# Patient Record
Sex: Male | Born: 1950 | Race: White | Hispanic: No | Marital: Married | Smoking: Former smoker
Health system: Southern US, Community
[De-identification: ages and names within clinical notes are randomized; demographics above are authoritative.]

## PROBLEM LIST (undated history)

## (undated) DIAGNOSIS — R519 Headache, unspecified: Secondary | ICD-10-CM

## (undated) DIAGNOSIS — R51 Headache: Secondary | ICD-10-CM

## (undated) DIAGNOSIS — R39198 Other difficulties with micturition: Secondary | ICD-10-CM

## (undated) DIAGNOSIS — Z842 Family history of other diseases of the genitourinary system: Secondary | ICD-10-CM

## (undated) DIAGNOSIS — K30 Functional dyspepsia: Secondary | ICD-10-CM

## (undated) DIAGNOSIS — M1990 Unspecified osteoarthritis, unspecified site: Secondary | ICD-10-CM

## (undated) DIAGNOSIS — G43909 Migraine, unspecified, not intractable, without status migrainosus: Secondary | ICD-10-CM

## (undated) DIAGNOSIS — Z973 Presence of spectacles and contact lenses: Secondary | ICD-10-CM

## (undated) DIAGNOSIS — R12 Heartburn: Secondary | ICD-10-CM

## (undated) DIAGNOSIS — Z789 Other specified health status: Secondary | ICD-10-CM

## (undated) HISTORY — DX: Heartburn: R12

## (undated) HISTORY — DX: Migraine, unspecified, not intractable, without status migrainosus: G43.909

## (undated) HISTORY — DX: Unspecified osteoarthritis, unspecified site: M19.90

## (undated) HISTORY — DX: Other difficulties with micturition: R39.198

## (undated) HISTORY — DX: Headache, unspecified: R51.9

## (undated) HISTORY — DX: Family history of other diseases of the genitourinary system: Z84.2

## (undated) HISTORY — DX: Functional dyspepsia: K30

## (undated) HISTORY — DX: Headache: R51

## (undated) HISTORY — PX: TONSILLECTOMY: SUR1361

---
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
## 1999-11-26 ENCOUNTER — Ambulatory Visit (HOSPITAL_BASED_OUTPATIENT_CLINIC_OR_DEPARTMENT_OTHER): Admission: RE | Admit: 1999-11-26 | Discharge: 1999-11-26 | Payer: Self-pay | Admitting: Otolaryngology

## 1999-12-20 ENCOUNTER — Ambulatory Visit (HOSPITAL_BASED_OUTPATIENT_CLINIC_OR_DEPARTMENT_OTHER): Admission: RE | Admit: 1999-12-20 | Discharge: 1999-12-20 | Payer: Self-pay | Admitting: Otolaryngology

## 2012-06-29 ENCOUNTER — Other Ambulatory Visit: Payer: Self-pay | Admitting: Family Medicine

## 2012-06-29 DIAGNOSIS — Z0000 Encounter for general adult medical examination without abnormal findings: Secondary | ICD-10-CM

## 2012-06-30 ENCOUNTER — Ambulatory Visit (INDEPENDENT_AMBULATORY_CARE_PROVIDER_SITE_OTHER): Payer: BC Managed Care – PPO | Admitting: Surgery

## 2012-07-05 ENCOUNTER — Ambulatory Visit
Admission: RE | Admit: 2012-07-05 | Discharge: 2012-07-05 | Disposition: A | Discharge status: Home / Self Care | Payer: BC Managed Care – PPO | Source: Ambulatory Visit | Attending: Family Medicine | Admitting: Family Medicine

## 2012-07-05 DIAGNOSIS — Z0000 Encounter for general adult medical examination without abnormal findings: Secondary | ICD-10-CM

## 2012-07-11 ENCOUNTER — Encounter (INDEPENDENT_AMBULATORY_CARE_PROVIDER_SITE_OTHER): Payer: Self-pay | Admitting: Surgery

## 2012-07-12 ENCOUNTER — Encounter (INDEPENDENT_AMBULATORY_CARE_PROVIDER_SITE_OTHER): Payer: Self-pay | Admitting: Surgery

## 2012-07-12 ENCOUNTER — Encounter (INDEPENDENT_AMBULATORY_CARE_PROVIDER_SITE_OTHER): Payer: Self-pay | Admitting: General Surgery

## 2012-07-12 ENCOUNTER — Ambulatory Visit (INDEPENDENT_AMBULATORY_CARE_PROVIDER_SITE_OTHER): Payer: BC Managed Care – PPO | Admitting: Surgery

## 2012-07-12 VITALS — BP 132/84 | HR 76 | Temp 97.6°F | Resp 18 | Ht 74.5 in | Wt 195.0 lb

## 2012-07-12 DIAGNOSIS — L723 Sebaceous cyst: Secondary | ICD-10-CM

## 2012-07-12 DIAGNOSIS — L729 Follicular cyst of the skin and subcutaneous tissue, unspecified: Secondary | ICD-10-CM

## 2012-07-12 NOTE — Progress Notes (Signed)
Patient ID: Bernard Gonzalez, male   DOB: 09-Oct-1950, 62 y.o.   MRN: [id number]  Chief Complaint  Patient presents with  . New Evaluation    cyst left buttock    HPI Bernard Gonzalez is a 62 y.o. male.   HPI This is a pleasant gentleman referred by Dr. Tenny Craw for evaluation of a chronic cyst on his left buttock. This has been present for over a year it intermittently drains. He has needed intermittent antibiotics for recurrent infections. Currently, he is without pain. He has had sebaceous cysts in the past. He has no previous history of trauma to this area History reviewed. No pertinent past medical history.  Past Surgical History  Procedure Laterality Date  . Tibia fracture surgery    . Tonsillectomy      History reviewed. No pertinent family history.  Social History History  Substance Use Topics  . Smoking status: Former Smoker -- 2.00 packs/day    Types: Cigarettes  . Smokeless tobacco: Not on file     Comment: stppoed 07/03/12  . Alcohol Use: Yes     Comment: social wine and beer    No Known Allergies  Current Outpatient Prescriptions  Medication Sig Dispense Refill  . calcium-vitamin D (OSCAL) 250-125 MG-UNIT per tablet Take 1 tablet by mouth daily.      Marland Kitchen ibuprofen (ADVIL,MOTRIN) 200 MG tablet Take 200 mg by mouth every 6 (six) hours as needed for pain.      . multivitamin-iron-minerals-folic acid (CENTRUM) chewable tablet Chew 1 tablet by mouth daily.      . sildenafil (VIAGRA) 100 MG tablet Take 100 mg by mouth daily as needed for erectile dysfunction.       No current facility-administered medications for this visit.    Review of Systems Review of Systems  Constitutional: Negative for fever, chills and unexpected weight change.  HENT: Negative for hearing loss, congestion, sore throat, trouble swallowing and voice change.   Eyes: Negative for visual disturbance.  Respiratory: Negative for cough and wheezing.   Cardiovascular: Negative for chest pain, palpitations and  leg swelling.  Gastrointestinal: Negative for nausea, vomiting, abdominal pain, diarrhea, constipation, blood in stool, abdominal distention, anal bleeding and rectal pain.  Genitourinary: Negative for hematuria and difficulty urinating.  Musculoskeletal: Negative for arthralgias.  Skin: Negative for rash and wound.  Neurological: Negative for seizures, syncope, weakness and headaches.  Hematological: Negative for adenopathy. Does not bruise/bleed easily.  Psychiatric/Behavioral: Negative for confusion.    Blood pressure 132/84, pulse 76, temperature 97.6 F (36.4 C), resp. rate 18, height 6' 2.5" (1.892 m), weight 195 lb (88.451 kg).  Physical Exam Physical Exam  Constitutional: He is oriented to person, place, and time. He appears well-developed and well-nourished. No distress.  HENT:  Head: Normocephalic and atraumatic.  Right Ear: External ear normal.  Left Ear: External ear normal.  Nose: Nose normal.  Mouth/Throat: Oropharynx is clear and moist.  Eyes: Conjunctivae are normal. Pupils are equal, round, and reactive to light. Right eye exhibits no discharge. Left eye exhibits no discharge. No scleral icterus.  Neck: Normal range of motion. Neck supple. No tracheal deviation present.  Cardiovascular: Normal rate, regular rhythm, normal heart sounds and intact distal pulses.   No murmur heard. Pulmonary/Chest: Effort normal and breath sounds normal. He has no wheezes.  Abdominal: Soft. Bowel sounds are normal. He exhibits no distension. There is no tenderness.  Musculoskeletal: Normal range of motion. He exhibits no edema and no tenderness.  Lymphadenopathy:  He has no cervical adenopathy.  Neurological: He is alert and oriented to person, place, and time.  Skin: Skin is warm. He is not diaphoretic.  There is a 2 cm chronic cyst on the left buttock just inferior and lateral to the gluteal cleft. There is a small opening in the skin with minimal erythema.  There is some chronic  induration and scarring  Psychiatric: His behavior is normal. Judgment normal.    Data Reviewed   Assessment    Chronic left buttock cyst     Plan    Because of his recurrent infections, removal of this area in the operating room as recommended. I discussed this with him in detail. I discussed the risks of surgery which includes but is not limited to bleeding, infection, recurrence, injury to surrounding structures, etc. He understands and wishes to proceed. Surgery will be scheduled        Bernard Gonzalez A 07/12/2012, 11:35 AM

## 2012-07-27 ENCOUNTER — Encounter (HOSPITAL_BASED_OUTPATIENT_CLINIC_OR_DEPARTMENT_OTHER): Payer: Self-pay | Admitting: *Deleted

## 2012-07-27 NOTE — H&P (Signed)
Patient ID: Bernard Gonzalez, male DOB: 04/10/1950, 62 y.o. MRN: [id number]  Chief Complaint   Patient presents with   .  New Evaluation     cyst left buttock   HPI  Bernard Gonzalez is a 62 y.o. male.  HPI  This is a pleasant gentleman referred by Dr. Tenny Craw for evaluation of a chronic cyst on his left buttock. This has been present for over a year it intermittently drains. He has needed intermittent antibiotics for recurrent infections. Currently, he is without pain. He has had sebaceous cysts in the past. He has no previous history of trauma to this area  History reviewed. No pertinent past medical history.  Past Surgical History   Procedure  Laterality  Date   .  Tibia fracture surgery     .  Tonsillectomy     History reviewed. No pertinent family history.  Social History  History   Substance Use Topics   .  Smoking status:  Former Smoker -- 2.00 packs/day     Types:  Cigarettes   .  Smokeless tobacco:  Not on file      Comment: stppoed 07/03/12   .  Alcohol Use:  Yes      Comment: social wine and beer   No Known Allergies  Current Outpatient Prescriptions   Medication  Sig  Dispense  Refill   .  calcium-vitamin D (OSCAL) 250-125 MG-UNIT per tablet  Take 1 tablet by mouth daily.     Marland Kitchen  ibuprofen (ADVIL,MOTRIN) 200 MG tablet  Take 200 mg by mouth every 6 (six) hours as needed for pain.     .  multivitamin-iron-minerals-folic acid (CENTRUM) chewable tablet  Chew 1 tablet by mouth daily.     .  sildenafil (VIAGRA) 100 MG tablet  Take 100 mg by mouth daily as needed for erectile dysfunction.      No current facility-administered medications for this visit.   Review of Systems  Review of Systems  Constitutional: Negative for fever, chills and unexpected weight change.  HENT: Negative for hearing loss, congestion, sore throat, trouble swallowing and voice change.  Eyes: Negative for visual disturbance.  Respiratory: Negative for cough and wheezing.  Cardiovascular: Negative for chest  pain, palpitations and leg swelling.  Gastrointestinal: Negative for nausea, vomiting, abdominal pain, diarrhea, constipation, blood in stool, abdominal distention, anal bleeding and rectal pain.  Genitourinary: Negative for hematuria and difficulty urinating.  Musculoskeletal: Negative for arthralgias.  Skin: Negative for rash and wound.  Neurological: Negative for seizures, syncope, weakness and headaches.  Hematological: Negative for adenopathy. Does not bruise/bleed easily.  Psychiatric/Behavioral: Negative for confusion.  Blood pressure 132/84, pulse 76, temperature 97.6 F (36.4 C), resp. rate 18, height 6' 2.5" (1.892 m), weight 195 lb (88.451 kg).  Physical Exam  Physical Exam  Constitutional: He is oriented to person, place, and time. He appears well-developed and well-nourished. No distress.  HENT:  Head: Normocephalic and atraumatic.  Right Ear: External ear normal.  Left Ear: External ear normal.  Nose: Nose normal.  Mouth/Throat: Oropharynx is clear and moist.  Eyes: Conjunctivae are normal. Pupils are equal, round, and reactive to light. Right eye exhibits no discharge. Left eye exhibits no discharge. No scleral icterus.  Neck: Normal range of motion. Neck supple. No tracheal deviation present.  Cardiovascular: Normal rate, regular rhythm, normal heart sounds and intact distal pulses.  No murmur heard.  Pulmonary/Chest: Effort normal and breath sounds normal. He has no wheezes.  Abdominal: Soft. Bowel sounds  are normal. He exhibits no distension. There is no tenderness.  Musculoskeletal: Normal range of motion. He exhibits no edema and no tenderness.  Lymphadenopathy:  He has no cervical adenopathy.  Neurological: He is alert and oriented to person, place, and time.  Skin: Skin is warm. He is not diaphoretic.  There is a 2 cm chronic cyst on the left buttock just inferior and lateral to the gluteal cleft. There is a small opening in the skin with minimal erythema. There is  some chronic induration and scarring  Psychiatric: His behavior is normal. Judgment normal.  Data Reviewed  Assessment  Chronic left buttock cyst  Plan  Because of his recurrent infections, removal of this area in the operating room as recommended. I discussed this with him in detail. I discussed the risks of surgery which includes but is not limited to bleeding, infection, recurrence, injury to surrounding structures, etc. He understands and wishes to proceed. Surgery will be scheduled

## 2012-07-27 NOTE — Progress Notes (Signed)
No heart or resp problems-no labs needed 

## 2012-07-28 ENCOUNTER — Ambulatory Visit (HOSPITAL_BASED_OUTPATIENT_CLINIC_OR_DEPARTMENT_OTHER)
Admission: RE | Admit: 2012-07-28 | Discharge: 2012-07-28 | Disposition: A | Discharge status: Home / Self Care | Payer: BC Managed Care – PPO | Source: Ambulatory Visit | Attending: Surgery | Admitting: Surgery

## 2012-07-28 ENCOUNTER — Encounter (HOSPITAL_BASED_OUTPATIENT_CLINIC_OR_DEPARTMENT_OTHER): Payer: Self-pay

## 2012-07-28 ENCOUNTER — Encounter (HOSPITAL_BASED_OUTPATIENT_CLINIC_OR_DEPARTMENT_OTHER)
Admission: RE | Disposition: A | Discharge status: Home / Self Care | Payer: Self-pay | Source: Ambulatory Visit | Attending: Surgery

## 2012-07-28 ENCOUNTER — Ambulatory Visit (HOSPITAL_BASED_OUTPATIENT_CLINIC_OR_DEPARTMENT_OTHER): Payer: BC Managed Care – PPO | Admitting: Anesthesiology

## 2012-07-28 ENCOUNTER — Encounter (HOSPITAL_BASED_OUTPATIENT_CLINIC_OR_DEPARTMENT_OTHER): Payer: Self-pay | Admitting: Anesthesiology

## 2012-07-28 DIAGNOSIS — L723 Sebaceous cyst: Secondary | ICD-10-CM | POA: Insufficient documentation

## 2012-07-28 DIAGNOSIS — Z87891 Personal history of nicotine dependence: Secondary | ICD-10-CM | POA: Insufficient documentation

## 2012-07-28 HISTORY — DX: Other specified health status: Z78.9

## 2012-07-28 HISTORY — DX: Presence of spectacles and contact lenses: Z97.3

## 2012-07-28 HISTORY — PX: MASS EXCISION: SHX2000

## 2012-07-28 SURGERY — EXCISION MASS
Anesthesia: Monitor Anesthesia Care | Site: Buttocks | Laterality: Left | Wound class: Clean

## 2012-07-28 MED ORDER — MIDAZOLAM HCL 2 MG/2ML IJ SOLN: 1.0000 mg | INTRAMUSCULAR | Status: DC | PRN

## 2012-07-28 MED ORDER — MIDAZOLAM HCL 5 MG/5ML IJ SOLN
INTRAMUSCULAR | Status: DC | PRN
  Administered 2012-07-28 (×2): 1 mg via INTRAVENOUS

## 2012-07-28 MED ORDER — FENTANYL CITRATE 0.05 MG/ML IJ SOLN: 50.0000 ug | INTRAMUSCULAR | Status: DC | PRN

## 2012-07-28 MED ORDER — KETOROLAC TROMETHAMINE 30 MG/ML IJ SOLN
INTRAMUSCULAR | Status: DC | PRN
  Administered 2012-07-28: 30 mg via INTRAVENOUS

## 2012-07-28 MED ORDER — PROPOFOL 10 MG/ML IV BOLUS
INTRAVENOUS | Status: DC | PRN
  Administered 2012-07-28 (×2): 10 mg via INTRAVENOUS

## 2012-07-28 MED ORDER — ONDANSETRON HCL 4 MG/2ML IJ SOLN
INTRAMUSCULAR | Status: DC | PRN
  Administered 2012-07-28: 4 mg via INTRAVENOUS

## 2012-07-28 MED ORDER — LACTATED RINGERS IV SOLN
INTRAVENOUS | Status: DC
  Administered 2012-07-28: 09:00:00 via INTRAVENOUS

## 2012-07-28 MED ORDER — FENTANYL CITRATE 0.05 MG/ML IJ SOLN
INTRAMUSCULAR | Status: DC | PRN
  Administered 2012-07-28 (×2): 50 ug via INTRAVENOUS

## 2012-07-28 MED ORDER — LIDOCAINE HCL (CARDIAC) 20 MG/ML IV SOLN
INTRAVENOUS | Status: DC | PRN
  Administered 2012-07-28: 50 mg via INTRAVENOUS

## 2012-07-28 MED ORDER — LIDOCAINE HCL (PF) 1 % IJ SOLN
INTRAMUSCULAR | Status: DC | PRN
  Administered 2012-07-28: 10 mL

## 2012-07-28 MED ORDER — HYDROCODONE-ACETAMINOPHEN 5-325 MG PO TABS: 1.0000 | ORAL_TABLET | ORAL | Status: DC | PRN

## 2012-07-28 MED ORDER — CEFAZOLIN SODIUM-DEXTROSE 2-3 GM-% IV SOLR
2.0000 g | INTRAVENOUS | Status: AC
  Administered 2012-07-28: 2 g via INTRAVENOUS

## 2012-07-28 SURGICAL SUPPLY — 46 items
APL SKNCLS STERI-STRIP NONHPOA (GAUZE/BANDAGES/DRESSINGS) ×1
BENZOIN TINCTURE PRP APPL 2/3 (GAUZE/BANDAGES/DRESSINGS) ×2 IMPLANT
BLADE HEX COATED 2.75 (ELECTRODE) ×2 IMPLANT
BLADE SURG 15 STRL LF DISP TIS (BLADE) ×1 IMPLANT
BLADE SURG 15 STRL SS (BLADE) ×2
BLADE SURG ROTATE 9660 (MISCELLANEOUS) IMPLANT
CANISTER SUCTION 1200CC (MISCELLANEOUS) IMPLANT
CHLORAPREP W/TINT 26ML (MISCELLANEOUS) ×2 IMPLANT
CLOTH BEACON ORANGE TIMEOUT ST (SAFETY) ×2 IMPLANT
COVER MAYO STAND STRL (DRAPES) ×2 IMPLANT
COVER TABLE BACK 60X90 (DRAPES) ×2 IMPLANT
DECANTER SPIKE VIAL GLASS SM (MISCELLANEOUS) ×1 IMPLANT
DRAPE PED LAPAROTOMY (DRAPES) ×2 IMPLANT
DRAPE UTILITY XL STRL (DRAPES) ×2 IMPLANT
DRSG TEGADERM 4X4.75 (GAUZE/BANDAGES/DRESSINGS) ×2 IMPLANT
ELECT REM PT RETURN 9FT ADLT (ELECTROSURGICAL) ×2
ELECTRODE REM PT RTRN 9FT ADLT (ELECTROSURGICAL) ×1 IMPLANT
GAUZE SPONGE 4X4 12PLY STRL LF (GAUZE/BANDAGES/DRESSINGS) ×2 IMPLANT
GLOVE BIO SURGEON STRL SZ7 (GLOVE) ×1 IMPLANT
GLOVE BIOGEL PI IND STRL 7.0 (GLOVE) IMPLANT
GLOVE BIOGEL PI IND STRL 7.5 (GLOVE) IMPLANT
GLOVE BIOGEL PI INDICATOR 7.0 (GLOVE) ×1
GLOVE BIOGEL PI INDICATOR 7.5 (GLOVE) ×1
GLOVE SURG SIGNA 7.5 PF LTX (GLOVE) ×2 IMPLANT
GLOVE SURG SS PI 7.5 STRL IVOR (GLOVE) ×1 IMPLANT
GOWN PREVENTION PLUS XLARGE (GOWN DISPOSABLE) ×2 IMPLANT
GOWN PREVENTION PLUS XXLARGE (GOWN DISPOSABLE) ×2 IMPLANT
NDL HYPO 25X1 1.5 SAFETY (NEEDLE) ×1 IMPLANT
NEEDLE HYPO 25X1 1.5 SAFETY (NEEDLE) ×2 IMPLANT
NS IRRIG 1000ML POUR BTL (IV SOLUTION) IMPLANT
PACK BASIN DAY SURGERY FS (CUSTOM PROCEDURE TRAY) ×2 IMPLANT
PENCIL BUTTON HOLSTER BLD 10FT (ELECTRODE) ×2 IMPLANT
SLEEVE SCD COMPRESS KNEE MED (MISCELLANEOUS) IMPLANT
SPONGE LAP 4X18 X RAY DECT (DISPOSABLE) ×2 IMPLANT
STRIP CLOSURE SKIN 1/2X4 (GAUZE/BANDAGES/DRESSINGS) ×2 IMPLANT
SUT MNCRL AB 4-0 PS2 18 (SUTURE) ×1 IMPLANT
SUT VIC AB 2-0 SH 27 (SUTURE)
SUT VIC AB 2-0 SH 27XBRD (SUTURE) IMPLANT
SUT VIC AB 3-0 SH 27 (SUTURE) ×4
SUT VIC AB 3-0 SH 27X BRD (SUTURE) IMPLANT
SYR BULB 3OZ (MISCELLANEOUS) IMPLANT
SYR CONTROL 10ML LL (SYRINGE) ×2 IMPLANT
TOWEL OR 17X24 6PK STRL BLUE (TOWEL DISPOSABLE) ×2 IMPLANT
TOWEL OR NON WOVEN STRL DISP B (DISPOSABLE) ×2 IMPLANT
TUBE CONNECTING 20X1/4 (TUBING) ×1 IMPLANT
YANKAUER SUCT BULB TIP NO VENT (SUCTIONS) ×1 IMPLANT

## 2012-07-28 NOTE — Anesthesia Procedure Notes (Signed)
Procedure Name: MAC Date/Time: 07/28/2012 9:28 AM Performed by: Zenia Resides D Pre-anesthesia Checklist: Patient identified, Timeout performed, Emergency Drugs available, Suction available and Patient being monitored Oxygen Delivery Method: Simple face mask

## 2012-07-28 NOTE — Transfer of Care (Signed)
Immediate Anesthesia Transfer of Care Note  Patient: Bernard Gonzalez  Procedure(s) Performed: Procedure(s): EXCISION chronic cyst left buttock (Left)  Patient Location: PACU  Anesthesia Type:MAC  Level of Consciousness: awake, alert  and oriented  Airway & Oxygen Therapy: Patient Spontanous Breathing and Patient connected to face mask oxygen  Post-op Assessment: Report given to PACU RN and Post -op Vital signs reviewed and stable  Post vital signs: Reviewed and stable  Complications: No apparent anesthesia complications

## 2012-07-28 NOTE — Anesthesia Preprocedure Evaluation (Addendum)
Anesthesia Evaluation  Patient identified by MRN, date of birth, ID band Patient awake    Reviewed: Allergy & Precautions, H&P , NPO status   Airway Mallampati: II      Dental   Pulmonary  breath sounds clear to auscultation        Cardiovascular Rhythm:Regular Rate:Normal     Neuro/Psych    GI/Hepatic negative GI ROS, Neg liver ROS,   Endo/Other    Renal/GU negative Renal ROS     Musculoskeletal   Abdominal   Peds  Hematology   Anesthesia Other Findings   Reproductive/Obstetrics                          Anesthesia Physical Anesthesia Plan  ASA: II  Anesthesia Plan: MAC   Post-op Pain Management:    Induction: Intravenous  Airway Management Planned: LMA  Additional Equipment:   Intra-op Plan:   Post-operative Plan: Extubation in OR  Informed Consent: I have reviewed the patients History and Physical, chart, labs and discussed the procedure including the risks, benefits and alternatives for the proposed anesthesia with the patient or authorized representative who has indicated his/her understanding and acceptance.   Dental advisory given  Plan Discussed with: CRNA and Anesthesiologist  Anesthesia Plan Comments:         Anesthesia Quick Evaluation

## 2012-07-28 NOTE — Discharge Instructions (Signed)
May shower tomorrow  Bandage as needed   Post Anesthesia Home Care Instructions  Activity: Get plenty of rest for the remainder of the day. A responsible adult should stay with you for 24 hours following the procedure.  For the next 24 hours, DO NOT: -Drive a car -Advertising copywriter -Drink alcoholic beverages -Take any medication unless instructed by your physician -Make any legal decisions or sign important papers.  Meals: Start with liquid foods such as gelatin or soup. Progress to regular foods as tolerated. Avoid greasy, spicy, heavy foods. If nausea and/or vomiting occur, drink only clear liquids until the nausea and/or vomiting subsides. Call your physician if vomiting continues.  Special Instructions/Symptoms: Your throat may feel dry or sore from the anesthesia or the breathing tube placed in your throat during surgery. If this causes discomfort, gargle with warm salt water. The discomfort should disappear within 24 hours. Call your surgeon if you experience:   1.  Fever over 101.0. 2.  Inability to urinate. 3.  Nausea and/or vomiting. 4.  Extreme swelling or bruising at the surgical site. 5.  Continued bleeding from the incision. 6.  Increased pain, redness or drainage from the incision. 7.  Problems related to your pain medication.

## 2012-07-28 NOTE — Op Note (Signed)
EXCISION chronic cyst left buttock  Procedure Note  Bernard Gonzalez 07/28/2012   Pre-op Diagnosis: chronic cyst left buttock     Post-op Diagnosis: same  Procedure(s): EXCISION chronic cyst left buttock (2cm)  Surgeon(s): Shelly Rubenstein, MD  Anesthesia: Monitor Anesthesia Care  Staff:  Circulator: Aleen Sells, RN Scrub Person: Vernie Ammons Bouchillon, RN; Donald Pore, CST  Estimated Blood Loss: Minimal               Specimens: sent to path         Procedure:  The patient was brought to the operating room and identified as the correct patient. He was placed supine on the operating room table and then turned into the left lateral decubitus position. Anesthesia was induced. His buttock was then prepped and draped in the usual sterile fashion. I anesthetized the skin around the palpable cyst on the left buttock with lidocaine. I made an elliptical incision with the scalpel. I then completely remove the cyst with the electrocautery. It was sent to pathology for evaluation. I then closed the subcutaneous tissue with interrupted 3-0 Vicryl sutures and closed the skin with a 2-0 nylon sutures. Gauze and a bandage was then applied. The patient tolerated the procedure well. All the counts were correct at the end of the procedure. The patient was then taken in a stable condition from the operating room to the recovery area. [name]   Date: 07/28/2012  Time: 9:52 AM

## 2012-07-28 NOTE — Interval H&P Note (Signed)
History and Physical Interval Note: no change in H and P  07/28/2012 8:06 AM  Bernard Gonzalez  has presented today for surgery, with the diagnosis of chronic cyst left buttock  The various methods of treatment have been discussed with the patient and family. After consideration of risks, benefits and other options for treatment, the patient has consented to  Procedure(s): EXCISION chronic cyst left buttock (Left) as a surgical intervention .  The patient's history has been reviewed, patient examined, no change in status, stable for surgery.  I have reviewed the patient's chart and labs.  Questions were answered to the patient's satisfaction.     Bernard Gonzalez A

## 2012-07-29 ENCOUNTER — Encounter (HOSPITAL_BASED_OUTPATIENT_CLINIC_OR_DEPARTMENT_OTHER): Payer: Self-pay | Admitting: Surgery

## 2012-08-01 ENCOUNTER — Telehealth (INDEPENDENT_AMBULATORY_CARE_PROVIDER_SITE_OTHER): Payer: Self-pay

## 2012-08-01 NOTE — Telephone Encounter (Signed)
The patient called regarding his sutures.  He is coming in 6/16 to have sutures removed.  He is scheduled to have his colonoscopy on 6/17.  He wants to know if he needs to push out the colonoscopy one week.  He is having a little bit of seepage too from the incision which I told him it's ok.  Just keep the incision clean and dry.  Cover it daily with gauze but change it as needed.  Please advise pt on colonoscopy.

## 2012-08-01 NOTE — Telephone Encounter (Signed)
Called patient back and told him that he may need to move his colonoscopy back another week since where his suture in that  area and he has drainage now and he is having to clean himself out for the colonoscopy that he wants this area to be closed. I told him that the area is not clean because of having BM. And he agree that it would be better for himself to have the colonoscopy moved to another day.

## 2012-08-04 NOTE — Anesthesia Postprocedure Evaluation (Signed)
  Anesthesia Post-op Note  Patient: Bernard Gonzalez  Procedure(s) Performed: Procedure(s): EXCISION chronic cyst left buttock (Left)  Patient Location: PACU  Anesthesia Type:General  Level of Consciousness: awake  Airway and Oxygen Therapy: Patient Spontanous Breathing  Post-op Pain: mild  Post-op Assessment: Post-op Vital signs reviewed  Post-op Vital Signs: Reviewed  Complications: No apparent anesthesia complications

## 2012-08-08 ENCOUNTER — Telehealth (INDEPENDENT_AMBULATORY_CARE_PROVIDER_SITE_OTHER): Payer: Self-pay | Admitting: General Surgery

## 2012-08-08 ENCOUNTER — Encounter (INDEPENDENT_AMBULATORY_CARE_PROVIDER_SITE_OTHER): Payer: BC Managed Care – PPO | Admitting: General Surgery

## 2012-08-08 NOTE — Telephone Encounter (Signed)
Patient came in today for suture removal on the buttock the surgery site was heal up well. I told the patient that he can see some drainage for a week or 2 do where the surgery site was. I told him that he can call us if he has any question

## 2012-08-23 ENCOUNTER — Encounter (INDEPENDENT_AMBULATORY_CARE_PROVIDER_SITE_OTHER): Payer: Self-pay | Admitting: Surgery

## 2012-08-23 ENCOUNTER — Ambulatory Visit (INDEPENDENT_AMBULATORY_CARE_PROVIDER_SITE_OTHER): Payer: BC Managed Care – PPO | Admitting: Surgery

## 2012-08-23 VITALS — BP 146/82 | HR 70 | Temp 97.6°F | Ht 74.5 in | Wt 196.8 lb

## 2012-08-23 DIAGNOSIS — Z09 Encounter for follow-up examination after completed treatment for conditions other than malignant neoplasm: Secondary | ICD-10-CM

## 2012-08-23 NOTE — Progress Notes (Signed)
Subjective:     Patient ID: Bernard Gonzalez, male   DOB: [birth year], 62 y.o.   MRN: [id number]  HPI He is here for another postop visit. He has had some drainage from the wound this is subsiding. He is otherwise doing well.  Review of Systems     Objective:   Physical Exam On exam, the incision is healing well. I inserted an 18-gauge needle as I suspected there was a seroma but found no fluid    Assessment:     stable postop     Plan:     He may resume his normal activity. I will see him back as needed

## 2012-09-14 ENCOUNTER — Encounter (INDEPENDENT_AMBULATORY_CARE_PROVIDER_SITE_OTHER): Payer: Self-pay | Admitting: General Surgery

## 2012-09-14 ENCOUNTER — Ambulatory Visit (INDEPENDENT_AMBULATORY_CARE_PROVIDER_SITE_OTHER): Payer: BC Managed Care – PPO | Admitting: General Surgery

## 2012-09-14 ENCOUNTER — Encounter (INDEPENDENT_AMBULATORY_CARE_PROVIDER_SITE_OTHER): Payer: Self-pay

## 2012-09-14 VITALS — BP 118/72 | HR 84 | Temp 99.0°F | Resp 16 | Ht 74.5 in | Wt 193.0 lb

## 2012-09-14 DIAGNOSIS — K611 Rectal abscess: Secondary | ICD-10-CM

## 2012-09-14 DIAGNOSIS — K612 Anorectal abscess: Secondary | ICD-10-CM

## 2012-09-14 MED ORDER — SULFAMETHOXAZOLE-TRIMETHOPRIM 400-80 MG PO TABS: 1.0000 | ORAL_TABLET | Freq: Two times a day (BID) | ORAL | Status: AC

## 2012-09-14 NOTE — Progress Notes (Signed)
Subjective:     Patient ID: Bernard Gonzalez, male   DOB: Jul 09, 1950, 62 y.o.   MRN: [id number]  HPI This patient has a history of a left buttock skin cyst removed about one month ago by Dr. Rayburn Ma. CT is having some discomfort in the rectal area and this was increasing until earlier this afternoon when he had spontaneous drainage from the area. He says that since it began draining it feels much better and it continues to drain. He denies any fevers or chills.  Review of Systems     Objective:   Physical Exam NAD, Nontoxic His prior left buttock wound is healing.  Just adjacent to the perirectal area on the left is a small area of fluctuance with a spot of central drainage of what looks to be serous fluid.  There is no cellulitis.  I was able to probe this area and there is no evidence of undrained purulence.      Assessment:     Perirectal abscess-draining I am not certain why he continues to have these abscesses.  This appears to be draining spontaneously.  I discussed with him the option for further opening the wound today but since this is already draining and does not appear to be purulent or any undrained fluid, I think that it will not likely need any further treatment acutely.  I have recommended a short course of abx and follow up in 1 week for reevaluation.  He agreed to call us tomorrow if he is not feeling better or any return of pain, fevers, or chills.  I also explained that this will be high risk for fistula formation.      Plan:     Since this is already draining, we will hold on formal I/D and treat with abx and follow up next week.  he will follow up sooner if this continues to cause discomfort or any fevers or chills.

## 2012-09-22 ENCOUNTER — Encounter (INDEPENDENT_AMBULATORY_CARE_PROVIDER_SITE_OTHER): Payer: Self-pay | Admitting: Surgery

## 2012-09-22 ENCOUNTER — Ambulatory Visit (INDEPENDENT_AMBULATORY_CARE_PROVIDER_SITE_OTHER): Payer: BC Managed Care – PPO | Admitting: Surgery

## 2012-09-22 VITALS — BP 140/68 | HR 72 | Temp 98.1°F | Resp 16 | Ht 74.5 in | Wt 192.8 lb

## 2012-09-22 DIAGNOSIS — Z09 Encounter for follow-up examination after completed treatment for conditions other than malignant neoplasm: Secondary | ICD-10-CM

## 2012-09-22 NOTE — Progress Notes (Signed)
Subjective:     Patient ID: Bernard Gonzalez, male   DOB: 1950-10-20, 62 y.o.   MRN: [id number]  HPI He is here for followup. He was seen by Dr. Darylene Price for a spontaneously draining perirectal abscess. I had recently operated on him for a chronic buttock cyst which was a sebaceous cyst.  He now reports he is doing well. Review of Systems     Objective:   Physical Exam The area of concern in the perianal abscess site is now almost completely healed. His previously removed cyst site is also healed     Assessment:     Stable postop     Plan:     I will see him back as needed

## 2012-10-27 ENCOUNTER — Other Ambulatory Visit: Payer: Self-pay | Admitting: Orthopedic Surgery

## 2012-10-31 ENCOUNTER — Other Ambulatory Visit: Payer: Self-pay | Admitting: Orthopedic Surgery

## 2012-10-31 DIAGNOSIS — Z01818 Encounter for other preprocedural examination: Secondary | ICD-10-CM

## 2012-11-03 ENCOUNTER — Ambulatory Visit
Admission: RE | Admit: 2012-11-03 | Discharge: 2012-11-03 | Disposition: A | Discharge status: Home / Self Care | Payer: BC Managed Care – PPO | Source: Ambulatory Visit | Attending: Orthopedic Surgery | Admitting: Orthopedic Surgery

## 2012-11-03 ENCOUNTER — Other Ambulatory Visit: Payer: BC Managed Care – PPO

## 2012-11-03 DIAGNOSIS — Z01818 Encounter for other preprocedural examination: Secondary | ICD-10-CM

## 2012-11-04 ENCOUNTER — Other Ambulatory Visit (HOSPITAL_COMMUNITY): Payer: Self-pay | Admitting: *Deleted

## 2012-11-04 NOTE — Pre-Procedure Instructions (Signed)
SHLOIMY MICHALSKI  11/04/2012    Your procedure is scheduled on:  November 10, 2012 at 11:00 AM  Report to [facility] at 9:00 AM.  Call this number if you have problems the morning of surgery: [phone]   Remember:   Do not eat food or drink liquids after midnight.   Take these medicines the morning of surgery with A SIP OF WATER: None   Stop all Vitamins, Herbal medications and Non-steroidals (Ibuprofen, Motrin, Aleve, Naproxen, etc.) as of today 11/07/12   Do not wear jewelry, make-up or nail polish.  Do not wear lotions, powders, or perfumes. You may wear deodorant.  Do not shave 48 hours prior to surgery. Men may shave face and neck.  Do not bring valuables to the hospital.  [facility] is not responsible                   for any belongings or valuables.  Contacts, dentures or bridgework may not be worn into surgery.  Leave suitcase in the car. After surgery it may be brought to your room.  For patients admitted to the hospital, checkout time is 11:00 AM the day of  discharge.     Special Instructions: Shower using CHG 2 nights before surgery and the night before surgery.  If you shower the day of surgery use CHG.  Use special wash - you have one bottle of CHG for all showers.  You should use approximately 1/3 of the bottle for each shower.   Please read over the following fact sheets that you were given: Pain Booklet, Coughing and Deep Breathing, Blood Transfusion Information, MRSA Information and Surgical Site Infection Prevention

## 2012-11-07 ENCOUNTER — Encounter (HOSPITAL_COMMUNITY): Payer: Self-pay

## 2012-11-07 ENCOUNTER — Ambulatory Visit (HOSPITAL_COMMUNITY)
Admission: RE | Admit: 2012-11-07 | Discharge: 2012-11-07 | Disposition: A | Discharge status: Home / Self Care | Payer: BC Managed Care – PPO | Source: Ambulatory Visit | Attending: Orthopedic Surgery | Admitting: Orthopedic Surgery

## 2012-11-07 ENCOUNTER — Encounter (HOSPITAL_COMMUNITY)
Admission: RE | Admit: 2012-11-07 | Discharge: 2012-11-07 | Disposition: A | Discharge status: Home / Self Care | Payer: BC Managed Care – PPO | Source: Ambulatory Visit | Attending: Orthopedic Surgery | Admitting: Orthopedic Surgery

## 2012-11-07 DIAGNOSIS — Z01818 Encounter for other preprocedural examination: Secondary | ICD-10-CM | POA: Insufficient documentation

## 2012-11-07 DIAGNOSIS — J438 Other emphysema: Secondary | ICD-10-CM | POA: Insufficient documentation

## 2012-11-07 DIAGNOSIS — Z01812 Encounter for preprocedural laboratory examination: Secondary | ICD-10-CM | POA: Insufficient documentation

## 2012-11-07 DIAGNOSIS — M439 Deforming dorsopathy, unspecified: Secondary | ICD-10-CM | POA: Insufficient documentation

## 2012-11-07 DIAGNOSIS — M19019 Primary osteoarthritis, unspecified shoulder: Secondary | ICD-10-CM | POA: Insufficient documentation

## 2012-11-07 DIAGNOSIS — Z01810 Encounter for preprocedural cardiovascular examination: Secondary | ICD-10-CM | POA: Insufficient documentation

## 2012-11-07 LAB — URINALYSIS, ROUTINE W REFLEX MICROSCOPIC
Bilirubin Urine: NEGATIVE
Hgb urine dipstick: NEGATIVE
Nitrite: NEGATIVE
Protein, ur: NEGATIVE mg/dL
Urobilinogen, UA: 1 mg/dL (ref 0.0–1.0)

## 2012-11-07 LAB — TYPE AND SCREEN
ABO/RH(D): O POS
Antibody Screen: NEGATIVE

## 2012-11-07 LAB — CBC WITH DIFFERENTIAL/PLATELET
Basophils Absolute: 0 10*3/uL (ref 0.0–0.1)
Basophils Relative: 0 % (ref 0–1)
Eosinophils Absolute: 0.1 10*3/uL (ref 0.0–0.7)
Eosinophils Relative: 1 % (ref 0–5)
Lymphs Abs: 1.9 10*3/uL (ref 0.7–4.0)
MCH: 28.6 pg (ref 26.0–34.0)
MCHC: 32.7 g/dL (ref 30.0–36.0)
MCV: 87.5 fL (ref 78.0–100.0)
Neutrophils Relative %: 71 % (ref 43–77)
Platelets: 246 10*3/uL (ref 150–400)
RDW: 13.9 % (ref 11.5–15.5)

## 2012-11-07 LAB — COMPREHENSIVE METABOLIC PANEL
ALT: 18 U/L (ref 0–53)
Albumin: 3.9 g/dL (ref 3.5–5.2)
Calcium: 9.7 mg/dL (ref 8.4–10.5)
GFR calc Af Amer: >90 mL/min (ref >90)
Glucose, Bld: 106 mg/dL — ABNORMAL HIGH (ref 70–99)
Potassium: 4.4 mEq/L (ref 3.5–5.1)
Sodium: 136 mEq/L (ref 135–145)
Total Protein: 7.8 g/dL (ref 6.0–8.3)

## 2012-11-07 LAB — SURGICAL PCR SCREEN: MRSA, PCR: NEGATIVE

## 2012-11-07 LAB — ABO/RH: ABO/RH(D): O POS

## 2012-11-07 NOTE — Pre-Procedure Instructions (Signed)
Bernard Gonzalez  11/07/2012   Your procedure is scheduled on:  11/10/12  Report to [facility] at 9 AM.  Call this number if you have problems the morning of surgery: [phone]   Remember:   Do not eat food or drink liquids after midnight.   Take these medicines the morning of surgery with A SIP OF WATER: vicodan   Do not wear jewelry, make-up or nail polish.  Do not wear lotions, powders, or perfumes. You may wear deodorant.  Do not shave 48 hours prior to surgery. Men may shave face and neck.  Do not bring valuables to the hospital.  [facility] is not responsible                   for any belongings or valuables.  Contacts, dentures or bridgework may not be worn into surgery.  Leave suitcase in the car. After surgery it may be brought to your room.  For patients admitted to the hospital, checkout time is 11:00 AM the day of  discharge.   Patients discharged the day of surgery will not be allowed to drive  home.  Name and phone number of your driver: wife  Special Instructions: Shower using CHG 2 nights before surgery and the night before surgery.  If you shower the day of surgery use CHG.  Use special wash - you have one bottle of CHG for all showers.  You should use approximately 1/3 of the bottle for each shower.   Please read over the following fact sheets that you were given: Pain Booklet, Coughing and Deep Breathing, Blood Transfusion Information, MRSA Information and Surgical Site Infection Prevention

## 2012-11-09 ENCOUNTER — Encounter (HOSPITAL_COMMUNITY): Payer: Self-pay | Admitting: Pharmacy Technician

## 2012-11-09 MED ORDER — CEFAZOLIN SODIUM-DEXTROSE 2-3 GM-% IV SOLR
2.0000 g | INTRAVENOUS | Status: AC
  Administered 2012-11-10: 2 g via INTRAVENOUS
  Filled 2012-11-09: qty 50

## 2012-11-10 ENCOUNTER — Encounter (HOSPITAL_COMMUNITY): Payer: Self-pay | Admitting: Certified Registered Nurse Anesthetist

## 2012-11-10 ENCOUNTER — Encounter (HOSPITAL_COMMUNITY)
Admission: RE | Disposition: A | Discharge status: Home / Self Care | Payer: Self-pay | Source: Ambulatory Visit | Attending: Orthopedic Surgery

## 2012-11-10 ENCOUNTER — Inpatient Hospital Stay (HOSPITAL_COMMUNITY): Payer: BC Managed Care – PPO

## 2012-11-10 ENCOUNTER — Inpatient Hospital Stay (HOSPITAL_COMMUNITY): Payer: BC Managed Care – PPO | Admitting: Certified Registered Nurse Anesthetist

## 2012-11-10 ENCOUNTER — Inpatient Hospital Stay (HOSPITAL_COMMUNITY)
Admission: RE | Admit: 2012-11-10 | Discharge: 2012-11-11 | DRG: 491 | Disposition: A | Discharge status: Home / Self Care | Payer: BC Managed Care – PPO | Source: Ambulatory Visit | Attending: Orthopedic Surgery | Admitting: Orthopedic Surgery

## 2012-11-10 DIAGNOSIS — M19012 Primary osteoarthritis, left shoulder: Secondary | ICD-10-CM | POA: Diagnosis present

## 2012-11-10 DIAGNOSIS — F17200 Nicotine dependence, unspecified, uncomplicated: Secondary | ICD-10-CM | POA: Diagnosis present

## 2012-11-10 DIAGNOSIS — M19019 Primary osteoarthritis, unspecified shoulder: Principal | ICD-10-CM | POA: Diagnosis present

## 2012-11-10 HISTORY — PX: TOTAL SHOULDER ARTHROPLASTY: SHX126

## 2012-11-10 SURGERY — ARTHROPLASTY, SHOULDER, TOTAL
Anesthesia: General | Site: Shoulder | Laterality: Left | Wound class: Clean

## 2012-11-10 MED ORDER — BUPIVACAINE-EPINEPHRINE PF 0.5-1:200000 % IJ SOLN
INTRAMUSCULAR | Status: DC | PRN
  Administered 2012-11-10: 30 mL

## 2012-11-10 MED ORDER — ONDANSETRON HCL 4 MG/2ML IJ SOLN
INTRAMUSCULAR | Status: DC | PRN
  Administered 2012-11-10: 4 mg via INTRAVENOUS

## 2012-11-10 MED ORDER — PHENYLEPHRINE HCL 10 MG/ML IJ SOLN
10.0000 mg | INTRAVENOUS | Status: DC | PRN
  Administered 2012-11-10: 50 ug/min via INTRAVENOUS

## 2012-11-10 MED ORDER — PROPOFOL 10 MG/ML IV BOLUS
INTRAVENOUS | Status: DC | PRN
  Administered 2012-11-10: 200 mg via INTRAVENOUS

## 2012-11-10 MED ORDER — MIDAZOLAM HCL 5 MG/ML IJ SOLN: 2.0000 mg | Freq: Once | INTRAMUSCULAR | Status: DC

## 2012-11-10 MED ORDER — ACETAMINOPHEN 325 MG PO TABS: 650.0000 mg | ORAL_TABLET | Freq: Four times a day (QID) | ORAL | Status: DC | PRN

## 2012-11-10 MED ORDER — ACETAMINOPHEN 650 MG RE SUPP: 650.0000 mg | Freq: Four times a day (QID) | RECTAL | Status: DC | PRN

## 2012-11-10 MED ORDER — MENTHOL 3 MG MT LOZG: 1.0000 | LOZENGE | OROMUCOSAL | Status: DC | PRN

## 2012-11-10 MED ORDER — OXYCODONE HCL 5 MG PO TABS
5.0000 mg | ORAL_TABLET | ORAL | Status: DC | PRN
  Administered 2012-11-11 (×2): 5 mg via ORAL
  Administered 2012-11-11: 10 mg via ORAL
  Filled 2012-11-10: qty 2
  Filled 2012-11-10: qty 1
  Filled 2012-11-10: qty 2

## 2012-11-10 MED ORDER — DIPHENHYDRAMINE HCL 12.5 MG/5ML PO ELIX: 12.5000 mg | ORAL_SOLUTION | ORAL | Status: DC | PRN

## 2012-11-10 MED ORDER — POVIDONE-IODINE 7.5 % EX SOLN
Freq: Once | CUTANEOUS | Status: DC
  Filled 2012-11-10: qty 118

## 2012-11-10 MED ORDER — OXYCODONE-ACETAMINOPHEN 5-325 MG PO TABS
1.0000 | ORAL_TABLET | ORAL | Status: DC | PRN
  Administered 2012-11-11 (×2): 2 via ORAL
  Filled 2012-11-10 (×2): qty 2

## 2012-11-10 MED ORDER — OXYCODONE HCL 5 MG/5ML PO SOLN: 5.0000 mg | Freq: Once | ORAL | Status: DC | PRN

## 2012-11-10 MED ORDER — ZOLPIDEM TARTRATE 5 MG PO TABS: 5.0000 mg | ORAL_TABLET | Freq: Every evening | ORAL | Status: DC | PRN

## 2012-11-10 MED ORDER — FENTANYL CITRATE 0.05 MG/ML IJ SOLN: 100.0000 ug | Freq: Once | INTRAMUSCULAR | Status: DC

## 2012-11-10 MED ORDER — DOCUSATE SODIUM 100 MG PO CAPS
100.0000 mg | ORAL_CAPSULE | Freq: Two times a day (BID) | ORAL | Status: DC
  Administered 2012-11-10 – 2012-11-11 (×2): 100 mg via ORAL
  Filled 2012-11-10 (×3): qty 1

## 2012-11-10 MED ORDER — LACTATED RINGERS IV SOLN
INTRAVENOUS | Status: DC | PRN
  Administered 2012-11-10 (×3): via INTRAVENOUS

## 2012-11-10 MED ORDER — MIDAZOLAM HCL 2 MG/2ML IJ SOLN
INTRAMUSCULAR | Status: AC
  Administered 2012-11-10: 2 mg
  Filled 2012-11-10: qty 2

## 2012-11-10 MED ORDER — NEOSTIGMINE METHYLSULFATE 1 MG/ML IJ SOLN
INTRAMUSCULAR | Status: DC | PRN
  Administered 2012-11-10: 4 mg via INTRAVENOUS

## 2012-11-10 MED ORDER — ASPIRIN EC 325 MG PO TBEC
325.0000 mg | DELAYED_RELEASE_TABLET | Freq: Two times a day (BID) | ORAL | Status: DC
  Administered 2012-11-10 – 2012-11-11 (×2): 325 mg via ORAL
  Filled 2012-11-10 (×4): qty 1

## 2012-11-10 MED ORDER — LIDOCAINE HCL (CARDIAC) 20 MG/ML IV SOLN
INTRAVENOUS | Status: DC | PRN
  Administered 2012-11-10: 70 mg via INTRAVENOUS

## 2012-11-10 MED ORDER — HYDROCODONE-ACETAMINOPHEN 5-325 MG PO TABS
1.0000 | ORAL_TABLET | ORAL | Status: DC | PRN
  Administered 2012-11-11: 2 via ORAL
  Filled 2012-11-10: qty 2

## 2012-11-10 MED ORDER — ROCURONIUM BROMIDE 100 MG/10ML IV SOLN
INTRAVENOUS | Status: DC | PRN
  Administered 2012-11-10: 50 mg via INTRAVENOUS

## 2012-11-10 MED ORDER — PHENYLEPHRINE HCL 10 MG/ML IJ SOLN
INTRAMUSCULAR | Status: DC | PRN
  Administered 2012-11-10: 80 ug via INTRAVENOUS
  Administered 2012-11-10: 40 ug via INTRAVENOUS
  Administered 2012-11-10: 80 ug via INTRAVENOUS

## 2012-11-10 MED ORDER — OXYCODONE-ACETAMINOPHEN 5-325 MG PO TABS: 1.0000 | ORAL_TABLET | ORAL | Status: DC | PRN

## 2012-11-10 MED ORDER — GLYCOPYRROLATE 0.2 MG/ML IJ SOLN
INTRAMUSCULAR | Status: DC | PRN
  Administered 2012-11-10: 0.6 mg via INTRAVENOUS

## 2012-11-10 MED ORDER — EPHEDRINE SULFATE 50 MG/ML IJ SOLN
INTRAMUSCULAR | Status: DC | PRN
  Administered 2012-11-10 (×2): 10 mg via INTRAVENOUS
  Administered 2012-11-10: 5 mg via INTRAVENOUS

## 2012-11-10 MED ORDER — ONDANSETRON HCL 4 MG/2ML IJ SOLN
4.0000 mg | Freq: Four times a day (QID) | INTRAMUSCULAR | Status: DC | PRN
  Administered 2012-11-11: 4 mg via INTRAVENOUS
  Filled 2012-11-10: qty 2

## 2012-11-10 MED ORDER — PROMETHAZINE HCL 25 MG/ML IJ SOLN: 6.2500 mg | INTRAMUSCULAR | Status: DC | PRN

## 2012-11-10 MED ORDER — OXYCODONE HCL 5 MG PO TABS: 5.0000 mg | ORAL_TABLET | Freq: Once | ORAL | Status: DC | PRN

## 2012-11-10 MED ORDER — METOCLOPRAMIDE HCL 5 MG/ML IJ SOLN: 5.0000 mg | Freq: Three times a day (TID) | INTRAMUSCULAR | Status: DC | PRN

## 2012-11-10 MED ORDER — POLYETHYLENE GLYCOL 3350 17 G PO PACK: 17.0000 g | PACK | Freq: Every day | ORAL | Status: DC | PRN

## 2012-11-10 MED ORDER — CEFAZOLIN SODIUM-DEXTROSE 2-3 GM-% IV SOLR
2.0000 g | Freq: Four times a day (QID) | INTRAVENOUS | Status: AC
  Administered 2012-11-10 – 2012-11-11 (×3): 2 g via INTRAVENOUS
  Filled 2012-11-10 (×3): qty 50

## 2012-11-10 MED ORDER — FENTANYL CITRATE 0.05 MG/ML IJ SOLN
INTRAMUSCULAR | Status: AC
  Administered 2012-11-10: 100 ug
  Filled 2012-11-10: qty 2

## 2012-11-10 MED ORDER — PHENOL 1.4 % MT LIQD: 1.0000 | OROMUCOSAL | Status: DC | PRN

## 2012-11-10 MED ORDER — BISACODYL 10 MG RE SUPP: 10.0000 mg | Freq: Every day | RECTAL | Status: DC | PRN

## 2012-11-10 MED ORDER — FLEET ENEMA 7-19 GM/118ML RE ENEM: 1.0000 | ENEMA | Freq: Once | RECTAL | Status: AC | PRN

## 2012-11-10 MED ORDER — ALUMINUM HYDROXIDE GEL 320 MG/5ML PO SUSP
15.0000 mL | ORAL | Status: DC | PRN
  Filled 2012-11-10: qty 30

## 2012-11-10 MED ORDER — HYDROMORPHONE HCL PF 1 MG/ML IJ SOLN: 0.2500 mg | INTRAMUSCULAR | Status: DC | PRN

## 2012-11-10 MED ORDER — FENTANYL CITRATE 0.05 MG/ML IJ SOLN
INTRAMUSCULAR | Status: DC | PRN
  Administered 2012-11-10 (×4): 50 ug via INTRAVENOUS

## 2012-11-10 MED ORDER — ONDANSETRON HCL 4 MG PO TABS: 4.0000 mg | ORAL_TABLET | Freq: Four times a day (QID) | ORAL | Status: DC | PRN

## 2012-11-10 MED ORDER — METOCLOPRAMIDE HCL 10 MG PO TABS: 5.0000 mg | ORAL_TABLET | Freq: Three times a day (TID) | ORAL | Status: DC | PRN

## 2012-11-10 MED ORDER — SODIUM CHLORIDE 0.9 % IV SOLN
INTRAVENOUS | Status: DC
  Administered 2012-11-11: 02:00:00 via INTRAVENOUS

## 2012-11-10 MED ORDER — MORPHINE SULFATE 2 MG/ML IJ SOLN
1.0000 mg | INTRAMUSCULAR | Status: DC | PRN
  Administered 2012-11-11 (×2): 1 mg via INTRAVENOUS
  Filled 2012-11-10 (×2): qty 1

## 2012-11-10 MED ORDER — SODIUM CHLORIDE 0.9 % IR SOLN
Status: DC | PRN
  Administered 2012-11-10: 1000 mL

## 2012-11-10 SURGICAL SUPPLY — 76 items
ASSEMBLY NECK TAPER FIXED 135 (Orthopedic Implant) ×1 IMPLANT
BIT DRILL 5/64X5 DISP (BIT) IMPLANT
BLADE SAW SAG 73X25 THK (BLADE) ×1
BLADE SAW SGTL 73X25 THK (BLADE) ×1 IMPLANT
BLADE SURG 15 STRL LF DISP TIS (BLADE) ×1 IMPLANT
BLADE SURG 15 STRL SS (BLADE) ×2
BOWL SMART MIX CTS (DISPOSABLE) ×1 IMPLANT
CEMENT BONE DEPUY (Cement) ×2 IMPLANT
CHLORAPREP W/TINT 26ML (MISCELLANEOUS) ×4 IMPLANT
CLOSURE STERI-STRIP 1/4X4 (GAUZE/BANDAGES/DRESSINGS) ×1 IMPLANT
CLOTH BEACON ORANGE TIMEOUT ST (SAFETY) ×1 IMPLANT
COVER SURGICAL LIGHT HANDLE (MISCELLANEOUS) ×2 IMPLANT
DRAPE INCISE IOBAN 66X45 STRL (DRAPES) ×4 IMPLANT
DRAPE SURG 17X23 STRL (DRAPES) ×2 IMPLANT
DRAPE U-SHAPE 47X51 STRL (DRAPES) ×2 IMPLANT
DRSG ADAPTIC 3X8 NADH LF (GAUZE/BANDAGES/DRESSINGS) IMPLANT
DRSG MEPILEX BORDER 4X4 (GAUZE/BANDAGES/DRESSINGS) ×1 IMPLANT
DRSG MEPILEX BORDER 4X8 (GAUZE/BANDAGES/DRESSINGS) ×2 IMPLANT
DRSG PAD ABDOMINAL 8X10 ST (GAUZE/BANDAGES/DRESSINGS) IMPLANT
ELECT BLADE 4.0 EZ CLEAN MEGAD (MISCELLANEOUS)
ELECT REM PT RETURN 9FT ADLT (ELECTROSURGICAL) ×2
ELECTRODE BLDE 4.0 EZ CLN MEGD (MISCELLANEOUS) IMPLANT
ELECTRODE REM PT RTRN 9FT ADLT (ELECTROSURGICAL) ×1 IMPLANT
EVACUATOR 1/8 PVC DRAIN (DRAIN) ×2 IMPLANT
GLENOID ANCHOR PEG CROSSLK 52 (Orthopedic Implant) ×1 IMPLANT
GLOVE BIO SURGEON STRL SZ7 (GLOVE) ×2 IMPLANT
GLOVE BIO SURGEON STRL SZ7.5 (GLOVE) ×2 IMPLANT
GLOVE BIOGEL PI IND STRL 7.0 (GLOVE) ×1 IMPLANT
GLOVE BIOGEL PI IND STRL 8 (GLOVE) ×1 IMPLANT
GLOVE BIOGEL PI INDICATOR 7.0 (GLOVE) ×1
GLOVE BIOGEL PI INDICATOR 8 (GLOVE) ×1
GOWN PREVENTION PLUS LG XLONG (DISPOSABLE) ×2 IMPLANT
GOWN STRL REIN XL XLG (GOWN DISPOSABLE) ×2 IMPLANT
HANDPIECE INTERPULSE COAX TIP (DISPOSABLE) ×2
HEAD ECCENTRIC HUMERAL SZ52X21 (Head) ×1 IMPLANT
HEMOSTAT SURGICEL 2X14 (HEMOSTASIS) ×1 IMPLANT
HOOD PEEL AWAY FACE SHEILD DIS (HOOD) ×5 IMPLANT
KIT BASIN OR (CUSTOM PROCEDURE TRAY) ×2 IMPLANT
KIT ROOM TURNOVER OR (KITS) ×2 IMPLANT
MANIFOLD NEPTUNE II (INSTRUMENTS) ×2 IMPLANT
NDL HYPO 25GX1X1/2 BEV (NEEDLE) IMPLANT
NDL MAYO TROCAR (NEEDLE) ×1 IMPLANT
NEEDLE HYPO 25GX1X1/2 BEV (NEEDLE) ×2 IMPLANT
NEEDLE MAYO TROCAR (NEEDLE) ×2 IMPLANT
NS IRRIG 1000ML POUR BTL (IV SOLUTION) ×2 IMPLANT
PACK SHOULDER (CUSTOM PROCEDURE TRAY) ×2 IMPLANT
PAD ARMBOARD 7.5X6 YLW CONV (MISCELLANEOUS) ×4 IMPLANT
PIN METAGLENE 2.5 (PIN) ×1 IMPLANT
RETRIEVER SUT HEWSON (MISCELLANEOUS) IMPLANT
SET HNDPC FAN SPRY TIP SCT (DISPOSABLE) ×1 IMPLANT
SLING ARM IMMOBILIZER LRG (SOFTGOODS) ×2 IMPLANT
SLING ARM IMMOBILIZER MED (SOFTGOODS) IMPLANT
SMARTMIX MINI TOWER (MISCELLANEOUS) ×2
SPONGE GAUZE 4X4 12PLY (GAUZE/BANDAGES/DRESSINGS) ×2 IMPLANT
SPONGE LAP 18X18 X RAY DECT (DISPOSABLE) ×2 IMPLANT
SPONGE LAP 4X18 X RAY DECT (DISPOSABLE) ×6 IMPLANT
STEM GLOBAL AP 12MM (Stem) ×1 IMPLANT
STRIP CLOSURE SKIN 1/2X4 (GAUZE/BANDAGES/DRESSINGS) ×2 IMPLANT
SUCTION FRAZIER TIP 10 FR DISP (SUCTIONS) ×2 IMPLANT
SUPPORT WRAP ARM LG (MISCELLANEOUS) ×2 IMPLANT
SUT ETHIBOND 2 OS 4 DA (SUTURE) IMPLANT
SUT ETHIBOND NAB CT1 #1 30IN (SUTURE) ×2 IMPLANT
SUT FIBERWIRE #2 38 T-5 BLUE (SUTURE) ×6
SUT MNCRL AB 4-0 PS2 18 (SUTURE) ×2 IMPLANT
SUT SILK 2 0 TIES 17X18 (SUTURE)
SUT SILK 2-0 18XBRD TIE BLK (SUTURE) IMPLANT
SUT VIC AB 0 CTB1 27 (SUTURE) IMPLANT
SUT VIC AB 2-0 CT1 27 (SUTURE) ×2
SUT VIC AB 2-0 CT1 TAPERPNT 27 (SUTURE) ×1 IMPLANT
SUTURE FIBERWR #2 38 T-5 BLUE (SUTURE) ×3 IMPLANT
SYR CONTROL 10ML LL (SYRINGE) IMPLANT
TOWEL OR 17X24 6PK STRL BLUE (TOWEL DISPOSABLE) ×2 IMPLANT
TOWEL OR 17X26 10 PK STRL BLUE (TOWEL DISPOSABLE) ×2 IMPLANT
TOWER SMARTMIX MINI (MISCELLANEOUS) ×1 IMPLANT
TRAY FOLEY CATH 16FRSI W/METER (SET/KITS/TRAYS/PACK) IMPLANT
WATER STERILE IRR 1000ML POUR (IV SOLUTION) ×2 IMPLANT

## 2012-11-10 NOTE — Anesthesia Preprocedure Evaluation (Signed)
Anesthesia Evaluation  Patient identified by MRN, date of birth, ID band Patient awake    Airway Mallampati: I      Dental   Pulmonary neg pulmonary ROS,  breath sounds clear to auscultation        Cardiovascular negative cardio ROS  Rhythm:Regular Rate:Normal     Neuro/Psych negative neurological ROS     GI/Hepatic negative GI ROS, Neg liver ROS,   Endo/Other  negative endocrine ROS  Renal/GU negative Renal ROS     Musculoskeletal   Abdominal   Peds  Hematology negative hematology ROS (+)   Anesthesia Other Findings   Reproductive/Obstetrics                           Anesthesia Physical Anesthesia Plan  ASA: I  Anesthesia Plan: General   Post-op Pain Management:    Induction: Intravenous  Airway Management Planned: Oral ETT  Additional Equipment:   Intra-op Plan:   Post-operative Plan: Extubation in OR  Informed Consent: I have reviewed the patients History and Physical, chart, labs and discussed the procedure including the risks, benefits and alternatives for the proposed anesthesia with the patient or authorized representative who has indicated his/her understanding and acceptance.   Dental advisory given  Plan Discussed with: CRNA and Surgeon  Anesthesia Plan Comments:         Anesthesia Quick Evaluation

## 2012-11-10 NOTE — Transfer of Care (Signed)
Immediate Anesthesia Transfer of Care Note  Patient: Bernard Gonzalez  Procedure(s) Performed: Procedure(s) with comments: TOTAL SHOULDER ARTHROPLASTY (Left) - Left total shoulder replacement  Patient Location: PACU  Anesthesia Type:GA combined with regional for post-op pain  Level of Consciousness: awake, alert , oriented and patient cooperative  Airway & Oxygen Therapy: Patient Spontanous Breathing and Patient connected to nasal cannula oxygen  Post-op Assessment: Report given to PACU RN, Post -op Vital signs reviewed and stable and Patient moving all extremities X 4  Post vital signs: Reviewed and stable  Complications: No apparent anesthesia complications

## 2012-11-10 NOTE — Anesthesia Procedure Notes (Addendum)
Anesthesia Regional Block:  Interscalene brachial plexus block  Pre-Anesthetic Checklist: ,, timeout performed, Correct Patient, Correct Site, Correct Laterality, Correct Procedure, Correct Position, site marked, Risks and benefits discussed, at surgeon's request and post-op pain management  Laterality: Upper and Left  Prep: chloraprep       Needles:  Injection technique: Single-shot  Needle Type: Echogenic Stimulator Needle      Needle Gauge: 22 and 22 G    Additional Needles:  Procedures: ultrasound guided (picture in chart) and nerve stimulator Interscalene brachial plexus block  Nerve Stimulator or Paresthesia:  Response: Twitch elicited, 0.5 mA, 0.3 ms,   Additional Responses:   Narrative:  Start time: 11/10/2012 9:50 AM End time: 11/10/2012 10:08 AM Injection made incrementally with aspirations every 5 mL.  Performed by: Personally  Anesthesiologist: Alma Friendly, MD  Additional Notes: Block assessed prior to start of surgery./ Tolerated well  Interscalene brachial plexus block Procedure Name: Intubation Date/Time: 11/10/2012 11:19 AM Performed by: Orvilla Fus A Pre-anesthesia Checklist: Patient identified, Timeout performed, Emergency Drugs available, Suction available and Patient being monitored Patient Re-evaluated:Patient Re-evaluated prior to inductionOxygen Delivery Method: Circle system utilized Preoxygenation: Pre-oxygenation with 100% oxygen Intubation Type: IV induction Ventilation: Oral airway inserted - appropriate to patient size and Mask ventilation without difficulty Laryngoscope Size: Mac and 4 Grade View: Grade I Tube type: Oral Tube size: 7.5 mm Number of attempts: 1 Airway Equipment and Method: Stylet Placement Confirmation: ETT inserted through vocal cords under direct vision,  breath sounds checked- equal and bilateral and positive ETCO2 Secured at: 22 cm Tube secured with: Tape Dental Injury: Teeth and Oropharynx as per pre-operative  assessment

## 2012-11-10 NOTE — Preoperative (Signed)
Beta Blockers   Reason not to administer Beta Blockers:Not Applicable 

## 2012-11-10 NOTE — H&P (Signed)
Bernard Gonzalez is an 62 y.o. male.   Chief Complaint: Left shoulder pain and dysfunction HPI: 62 year old gentleman with significant left shoulder pain with severe end-stage bone-on-bone arthritis with significant posterior glenoid wear. He has tried over-the-counter anti-inflammatory medicines, ice, rest, activity modifications without improvement. He wanted to go forward with surgical treatment.  Past Medical History  Diagnosis Date  . Medical history non-contributory   . Wears glasses     Past Surgical History  Procedure Laterality Date  . Tibia fracture surgery  1969    rt  . Tonsillectomy    . Mass excision Left 07/28/2012    Procedure: EXCISION chronic cyst left buttock;  Surgeon: Shelly Rubenstein, MD;  Location: [facility];  Service: General;  Laterality: Left;    History reviewed. No pertinent family history. Social History:  reports that he has been smoking Cigarettes.  He has been smoking about 1.00 pack per day. He does not have any smokeless tobacco history on file. He reports that  drinks alcohol. He reports that he does not use illicit drugs.  Allergies: No Known Allergies  Medications Prior to Admission  Medication Sig Dispense Refill  . calcium-vitamin D (OSCAL) 250-125 MG-UNIT per tablet Take 1 tablet by mouth daily.      . Cholecalciferol (VITAMIN D) 2000 UNITS CAPS Take 1 capsule by mouth daily.      Marland Kitchen ibuprofen (ADVIL,MOTRIN) 200 MG tablet Take 600 mg by mouth daily as needed for pain.         No results found for this or any previous visit (from the past 48 hour(s)). No results found.  Review of Systems  All other systems reviewed and are negative.    Blood pressure 134/71, pulse 62, temperature 97.3 F (36.3 C), temperature source Oral, resp. rate 20, SpO2 99.00%. Physical Exam  Constitutional: He is oriented to person, place, and time. He appears well-developed and well-nourished.  HENT:  Head: Atraumatic.  Eyes: EOM are normal.   Cardiovascular: Intact distal pulses.   Respiratory: Effort normal.  Musculoskeletal:       Left shoulder: He exhibits decreased range of motion and pain. He exhibits normal strength.  Neurological: He is alert and oriented to person, place, and time.  Skin: Skin is warm and dry.  Psychiatric: He has a normal mood and affect.     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 Risks / benefits of surgery discussed Consent on chart  NPO for OR Preop antibiotics   Bernard Gonzalez 11/10/2012, 10:26 AM

## 2012-11-10 NOTE — Op Note (Signed)
Procedure(s): TOTAL SHOULDER ARTHROPLASTY Procedure Note  Bernard Gonzalez male 62 y.o. 11/10/2012  Procedure(s) and Anesthesia Type:    * LEFT TOTAL SHOULDER ARTHROPLASTY - Choice  Surgeon(s) and Role:    * Mable Paris, MD - Primary   Indications:  62 y.o. male  With endstage left shoulder arthritis with a B2 glenoid with significant posterior wear and subluxation. Pain and dysfunction interfered with quality of life and nonoperative treatment with activity modification, NSAIDS failed.     Surgeon: Mable Paris   Assistants: Damita Lack PA-C [facility] was present and scrubbed throughout the procedure and was essential in positioning, retraction, exposure, and closure)  Anesthesia: General endotracheal anesthesia with preoperative interscalene block    Procedure Detail  TOTAL SHOULDER ARTHROPLASTY  Findings: DePuy size 12 proximally porous coated press-fit stem with a 52 x 21 standard head. 52 anchor peg glenoid. The anterior glenoid was preferentially reamed to correct the B2 morphology. Lesser tuberosity osteotomy was performed and repaired at the conclusion of the procedure  Estimated Blood Loss:  300 mL         Drains: 1 medium hemovac  Blood Given: none          Specimens: none        Complications:  * No complications entered in OR log *         Disposition: PACU - hemodynamically stable.         Condition: stable    Procedure:   The patient was identified in the preoperative holding area where I personally marked the operative extremity after verifying with the patient and consent. He  was taken to the operating room where He was transferred to the   operative table.  The patient received an interscalene block in   the holding area by the attending anesthesiologist.  General anesthesia was induced   in the operating room without complication.  The patient did receive IV  Ancef prior to the commencement of the procedure.  The  patient was   placed in the beach-chair position with the back raised about 30   degrees.  The nonoperative extremity and head and neck were carefully   positioned and padded protecting against neurovascular compromise.  The   left upper extremity was then prepped and draped in the standard sterile   fashion.    The appropriate operative time-out was performed with   Anesthesia, the perioperative staff, as well as myself and we all agreed   that the left side was the correct operative site.  An approximately   10 cm incision was made from the tip of the coracoid to the center point of the   humerus at the level of the axilla.  Dissection was carried down sharply   through subcutaneous tissues and cephalic vein was identified and taken   laterally with the deltoid.  The pectoralis major was taken medially.  The   upper 1 cm of the pectoralis major was released from its attachment on   the humerus.  The clavipectoral fascia was incised just lateral to the   conjoined tendon.  This incision was carried up to but not into the   coracoacromial ligament.  Digital palpation was used to prove   integrity of the axillary nerve which was protected throughout the   procedure.  Musculocutaneous nerve was not palpated in the operative   field.  Conjoined tendon was then retracted gently medially and the   deltoid laterally.  Anterior circumflex humeral vessels  were clamped and   coagulated.  The soft tissues overlying the biceps was incised and this   incision was carried across the transverse humeral ligament to the base   of the coracoid.  The biceps was tenodesed to the soft tissue just above   pectoralis major and the remaining portion of the biceps superiorly was   excised.  An osteotomy was performed at the lesser tuberosity and the   subscapularis was freed from the underlying capsule.  Capsule was then   released all the way down to the 6 o'clock position of the humeral head.   The humeral  head was then delivered with simultaneous adduction,   extension and external rotation.  All humeral osteophytes were removed   and the anatomic neck of the humerus was marked and cut free hand at   approximately 25 degrees retroversion within about 3 mm of the cuff   reflection posteriorly.  The head size was estimated to be a 52 medium   offset.  At that point, the humeral head was retracted posteriorly with   a Fukuda retractor and the anterior-inferior capsule was excised.   Remaining portion of the capsule was released at the base of the   coracoid.  The remaining biceps anchor and the entire anterior-inferior   labrum was excised.  The glenoid exposure was quite difficult given his posterior wear. The posterior labrum was also excised but the   posterior capsule was not released.  Posterior labrum is noted to be significantly hypertrophied. The guidepin was placed bicortically with +5 elevated guide.  The reamer was used to ream to concentric bone with punctate bleeding.  This gave a  concentric surface after reaming down the anterior glenoid preferentially.  The center hole was then drilled for an anchor peg glenoid followed by the three peripheral holes and none of the peripheral holes   exited the glenoid wall, but the central hole did exit.  I then pulse irrigated these holes and dried   them with Surgicel.  The three peripheral holes were then   pressurized cemented and the anchor peg glenoid was placed and impacted   with an excellent fit.  The glenoid was a 52 component.  The proximal humerus was then again exposed taking care not to displace the glenoid.    The humerus was then sequentially reamed going from 6 to 12 by 2 mm incriments. The 12 mm reamer was found to have appropriate cortical contact.  A   box osteotome was then used and a 12-mm broach.  The broach handle was   removed and the trial head was placed.   Calcar reamer was used.The eccentric 52 x 21 head fit best.  With the  trial implantation of the component, there was   approximately 50% posterior translation with immediate snap back to the   anatomic position.  With forward elevation, there was no tendency   towards posterior subluxation.   The trial was removed and the final implant was prepared on a back table.  The trial was removed and the final implant was prepared on a back table.   Small holes were drilled on both sides of the lesser tuberosity osteotomy, through which 3 #2 FiberWires were passed. The implant was then placed through the loop of all 3 #2 FiberWires and impacted with an excellent press-fit. This achieved excellent anatomic reconstruction of the proximal humerus.  The joint was then copiously irrigated with pulse lavage.  The subscapularis and  lesser tuberosity osteotomy were then repaired using the 3 #2 fiber wires previously passed.   One #1 Ethibond was placed at the rotator interval just above   the lesser tuberosity. After repair of the lesser tuberosity, a medium   Hemovac was placed out anterolaterally and again copious irrigation was   used.   Skin was closed with 2-0 Vicryl sutures in the deep dermal layer and 4-0 Monocryl in a subcuticular  running fashion.  Sterile dressings were then applied including Steri- Strips, 4x4s, ABDs and tape.  The patient was placed in a sling and allowed to awaken from general anesthesia and taken to the recovery room in stable  condition.      POSTOPERATIVE PLAN:  Early passive range of motion will be allowed with the goal of 40 degrees external rotation and 140 degrees forward elevation.  No internal rotation at this time.  No active motion of the arm until the lesser tuberosity heels.  The patient will likely be kept in the hospital for 1-2 days and then discharged home.

## 2012-11-11 LAB — CBC
MCH: 29 pg (ref 26.0–34.0)
MCV: 86.6 fL (ref 78.0–100.0)
Platelets: 203 10*3/uL (ref 150–400)
RBC: 4.34 MIL/uL (ref 4.22–5.81)
RDW: 14 % (ref 11.5–15.5)

## 2012-11-11 LAB — BASIC METABOLIC PANEL
Calcium: 8.5 mg/dL (ref 8.4–10.5)
Chloride: 101 mEq/L (ref 96–112)
Creatinine, Ser: 0.74 mg/dL (ref 0.50–1.35)
GFR calc Af Amer: >90 mL/min (ref >90)
Sodium: 134 mEq/L — ABNORMAL LOW (ref 135–145)

## 2012-11-11 NOTE — Progress Notes (Signed)
UR COMPLETED  

## 2012-11-11 NOTE — Evaluation (Signed)
Occupational Therapy Evaluation and Discharge Patient Details Name: DEMETRUIS DEPAUL MRN: [id number] DOB: Jan 25, 1951 Today's Date: 11/11/2012 Time: 4098-1191 OT Time Calculation (min): 35 min  OT Assessment / Plan / Recommendation History of present illness LEFT TOTAL SHOULDER ARTHROPLASTY    Clinical Impression   This 62 yo male s/p above presents to acute OT with all education completed. Will D/C from acute OT.    OT Assessment  Progress rehab of shoulder as ordered by MD at follow-up appointment    Follow Up Recommendations  No OT follow up       Equipment Recommendations  None recommended by OT          Precautions / Restrictions Precautions Precautions: Shoulder Shoulder Interventions: Shoulder sling/immobilizer;Off for dressing/bathing/exercises Required Braces or Orthoses: Sling Restrictions Weight Bearing Restrictions: Yes LUE Weight Bearing: Non weight bearing              Visit Information  Last OT Received On: 11/11/12 Assistance Needed: +1 History of Present Illness: LEFT TOTAL SHOULDER ARTHROPLASTY        Prior Functioning     Home Living Family/patient expects to be discharged to:: Private residence Living Arrangements: Spouse/significant other Available Help at Discharge: Family;Available 24 hours/day Prior Function Level of Independence: Independent Communication Communication: No difficulties Dominant Hand: Left         Vision/Perception Vision - History Patient Visual Report: No change from baseline   Cognition  Cognition Arousal/Alertness: Awake/alert Behavior During Therapy: WFL for tasks assessed/performed Overall Cognitive Status: Within Functional Limits for tasks assessed    Extremity/Trunk Assessment Upper Extremity Assessment Upper Extremity Assessment: LUE deficits/detail;RUE deficits/detail RUE Deficits / Details: Due to what the doctor believes is a nerve compression from laying so long in one position from  surgery RUE Coordination: decreased fine motor;decreased gross motor LUE Deficits / Details: Due to LTSA; elbow to hand WNL     Mobility Bed Mobility Bed Mobility: Supine to Sit;Sitting - Scoot to Edge of Bed;Sit to Supine Supine to Sit: 5: Supervision;HOB flat Sitting - Scoot to Edge of Bed: 5: Supervision Sit to Supine: 4: Min assist;HOB flat Transfers Transfers: Sit to Stand;Stand to Sit Sit to Stand: 7: Independent;With upper extremity assist;From bed Stand to Sit: 6: Modified independent (Device/Increase time);With upper extremity assist;With armrests;To chair/3-in-1     Exercise Other Exercises Other Exercises: Instructed in Dr// Chandler's protocol for shoulder flexion (PROM up to 140 degrees supine with other arm assisting) and external rotation (PROM up to 40 degrees with use of a stick, dowel rod, cane--supine) (10 reps 3x day today and then 5x day until follow up appointment with Dr. Ave Filter) Donning/doffing shirt without moving shoulder: Caregiver independent with task Method for sponge bathing under operated UE: Modified independent Donning/doffing sling/immobilizer: Caregiver independent with task Correct positioning of sling/immobilizer: Independent Pendulum exercises (written home exercise program):  (N/A) ROM for elbow, wrist and digits of operated UE: Independent Sling wearing schedule (on at all times/off for ADL's): Independent Proper positioning of operated UE when showering: Independent Dressing change:  (N/A) Positioning of UE while sleeping: Set-up      End of Session OT - End of Session Activity Tolerance: Patient tolerated treatment well Patient left: in chair;with call bell/phone within reach;with family/visitor present       Evette Georges [phone] 11/11/2012, 12:31 PM

## 2012-11-11 NOTE — Discharge Summary (Signed)
Patient ID: Bernard Gonzalez MRN: [id number] DOB/AGE: Feb 04, 1951 62 y.o.  Admit date: 11/10/2012 Discharge date: 11/11/2012  Admission Diagnoses:  Principal Problem:   Arthritis of shoulder region, left, degenerative   Discharge Diagnoses:  Same  Past Medical History  Diagnosis Date  . Medical history non-contributory   . Wears glasses     Surgeries: Procedure(s): TOTAL SHOULDER ARTHROPLASTY on 11/10/2012 - 11/11/2012   Consultants:    Discharged Condition: Improved  Hospital Course: Bernard Gonzalez is an 63 y.o. male who was admitted 11/10/2012 for operative treatment ofArthritis of shoulder region, left, degenerative. Patient has severe unremitting pain that affects sleep, daily activities, and work/hobbies. After pre-op clearance the patient was taken to the operating room on 11/10/2012 - 11/11/2012 and underwent  Procedure(s): TOTAL SHOULDER ARTHROPLASTY.    Patient was given perioperative antibiotics: Anti-infectives   Start     Dose/Rate Route Frequency Ordered Stop   11/10/12 2000  ceFAZolin (ANCEF) IVPB 2 g/50 mL premix     2 g 100 mL/hr over 30 Minutes Intravenous Every 6 hours 11/10/12 1746 11/11/12 0830   11/10/12 0600  ceFAZolin (ANCEF) IVPB 2 g/50 mL premix     2 g 100 mL/hr over 30 Minutes Intravenous On call to O.R. 11/09/12 1441 11/10/12 1122       Patient was given sequential compression devices, early ambulation, and ASA 325mg  BID to prevent DVT.  Postoperatively he developed right upper extremity neurapraxia, possibly PIN likely due to positioning during surgery. It was already recovering PO day 1 and he should have a complete recovery. We will monitor this outpatiently.Patient benefited maximally from hospital stay.      Recent vital signs: Patient Vitals for the past 24 hrs:  BP Temp Pulse Resp SpO2  11/11/12 1258 122/62 mmHg 97.9 F (36.6 C) 72 16 94 %  11/11/12 0617 106/61 mmHg 98.3 F (36.8 C) 71 18 96 %  11/11/12 0225 110/67 mmHg 99 F (37.2 C) 76 18  99 %  11/10/12 2215 112/63 mmHg 98.2 F (36.8 C) 72 18 99 %  11/10/12 1809 113/67 mmHg 97.9 F (36.6 C) 81 18 98 %  11/10/12 1700 - 98 F (36.7 C) 73 19 99 %  11/10/12 1645 - - 78 21 99 %  11/10/12 1630 113/61 mmHg - 71 18 100 %  11/10/12 1615 106/59 mmHg - 66 16 99 %  11/10/12 1600 109/61 mmHg - 72 21 100 %  11/10/12 1545 102/56 mmHg - 72 22 100 %  11/10/12 1530 112/62 mmHg - 70 18 98 %     Recent laboratory studies:  Recent Labs  11/11/12 0640  WBC 11.9*  HGB 12.6*  HCT 37.6*  PLT 203  NA 134*  K 4.2  CL 101  CO2 21  BUN 15  CREATININE 0.74  GLUCOSE 101*  CALCIUM 8.5     Discharge Medications:     Medication List    STOP taking these medications       ibuprofen 200 MG tablet  Commonly known as:  ADVIL,MOTRIN      TAKE these medications       calcium-vitamin D 250-125 MG-UNIT per tablet  Commonly known as:  OSCAL  Take 1 tablet by mouth daily.     oxyCODONE-acetaminophen 5-325 MG per tablet  Commonly known as:  ROXICET  Take 1-2 tablets by mouth every 4 (four) hours as needed for pain.     Vitamin D 2000 UNITS Caps  Take 1 capsule by mouth daily.  Diagnostic Studies: Dg Chest 2 View  11/07/2012   *RADIOLOGY REPORT*  Clinical Data: Preoperative exam prior to left shoulder replacement  CHEST - 2 VIEW  Comparison: No similar prior study is available for comparison.Left shoulder CT is reviewed.  This included incomplete imaging of the thoracic spine.  Findings: Hyperaeration and coarsening of the pulmonary markings is most compatible with emphysema.  No focal pulmonary opacity. Superior endplate compression deformity at the mid thoracic spine is re-identified.  Heart size is normal.  No focal pulmonary opacity.  Probable remote anterior left eighth rib fracture deformity.  Left shoulder degenerative change partly visualized. No pleural effusion.  IMPRESSION: Emphysematous changes without acute cardiopulmonary process.  Mid/inferior thoracic compression  deformity is re-identified but otherwise age indeterminate.   Original Report Authenticated By: Christiana Pellant, M.D.   Ct Shoulder Left Wo Contrast  11/03/2012   CLINICAL DATA:  Preop for left shoulder replacement surgery.  EXAM: CT OF THE LEFT SHOULDER WITHOUT CONTRAST  TECHNIQUE: Multidetector CT imaging was performed according to the standard protocol. Multiplanar CT image reconstructions were also generated.  COMPARISON:  Radiographs 09/26/2012.  FINDINGS: Significant glenohumeral joint degenerative changes with joint space narrowing, osteophytic spurring and subchondral cystic changes. There is a large inferior STIR projecting off the inferior aspect of the humeral head.  Minimal AC joint degenerative changes. Cystic changes noted in the acromion. The left ribs are intact. No clavicle fracture.  There are emphysematous changes noted in the left long and there is a small left upper lobe pulmonary nodule on image number 42 measuring approximately 5 mm. I would recommend a follow-up non contrast chest CT in 3-4 months to re-evaluate it and the rest of the lungs.  A small joint effusion is suspected. There is a benign-appearing lipoma in the subscapularis muscle.  IMPRESSION: Advanced glenohumeral joint degenerative changes with joint space narrowing, osteophytic spurring and subchondral cystic change.  5 mm left upper lobe pulmonary nodule. Recommend followup non contrast chest CT in 3-4 months.   Electronically Signed   By: Loralie Champagne M.D.   On: 11/03/2012 11:55   Dg Shoulder Left Port  11/10/2012   *RADIOLOGY REPORT*  Clinical Data: Post left-sided shoulder arthroplasty, initial encounter.  PORTABLE LEFT SHOULDER - 2+ VIEW  Comparison: 09/26/2012; left shoulder CT - 10/24/2012  Findings:  Post left humeral hemiarthroplasty.  A surgical drain overlies the operative site. No definite fracture or dislocation.  Alignment appears near anatomic.  No evidence of complication.  Limited visualization of this  thorax demonstrates perihilar opacities favored to represent atelectasis.  IMPRESSION: Post left humeral hemiarthroplasty without evidence of complication.   Original Report Authenticated By: Tacey Ruiz, MD    Disposition: 01-Home or Self Care      Discharge Orders   Future Orders Complete By Expires   Call MD / Call 911  As directed    Comments:     If you experience chest pain or shortness of breath, CALL 911 and be transported to the hospital emergency room.  If you develope a fever above 101 F, pus (white drainage) or increased drainage or redness at the wound, or calf pain, call your surgeon's office.   Constipation Prevention  As directed    Comments:     Drink plenty of fluids.  Prune juice may be helpful.  You may use a stool softener, such as Colace (over the counter) 100 mg twice a day.  Use MiraLax (over the counter) for constipation as needed.   Diet -  low sodium heart healthy  As directed    Increase activity slowly as tolerated  As directed       Follow-up Information   Follow up with Mable Paris, MD. Schedule an appointment as soon as possible for a visit in 2 weeks.   Specialty:  Orthopedic Surgery   Contact information:   [address] [place] [address] [phone]        Signed: Jiles Harold 11/11/2012, 3:27 PM

## 2012-11-11 NOTE — Progress Notes (Signed)
PATIENT ID: Bernard Gonzalez   1 Day Post-Op Procedure(s) (LRB): TOTAL SHOULDER ARTHROPLASTY (Left)  Subjective: block wore off at 3 AM.  Just now catching up with pain.  No numbness tingling or weakness and left hand.  He did develop right sided numbness and some weakness in his hand and wrist after surgery.  We evaluated this in the recovery room.  It is improving since yesterday.  Objective:  Filed Vitals:   11/11/12 0617  BP: 106/61  Pulse: 71  Temp: 98.3 F (36.8 C)  Resp: 18     Left shoulder dressing clean dry and intact.  Drain discontinued.  Distally neurovascularly intact on the left side.  Firing deltoid. Examination of the right side shows 5/5 biceps and triceps.  Distally he still has some mild weakness with 4 minus/5 strength in wrist extension and thumb extension.  Good grip strength.  He reports some decreased sensation in pins and needles feelings in the tips of all his fingers.  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 I reassured him that I think things look good on the right side and have our any shown significant improvement.  Despite significant effort at padding and positioning during surgery he seems to have a slight neuropraxia, possibly PIN.  This is already recovering and I think should have a complete recovery. We talked about exercises for the left side.  He is going to see OT today prior to discharge.  He should be held ago on this afternoon as long as his pain is well controlled with oral medications.  VTE proph: enteric-coated aspirin and SCDs

## 2012-11-11 NOTE — Anesthesia Postprocedure Evaluation (Signed)
  Anesthesia Post-op Note  Patient: Bernard Gonzalez  Procedure(s) Performed: Procedure(s) with comments: TOTAL SHOULDER ARTHROPLASTY (Left) - Left total shoulder replacement  Patient Location: PACU  Anesthesia Type:GA combined with regional for post-op pain  Level of Consciousness: awake and alert   Airway and Oxygen Therapy: Patient Spontanous Breathing  Post-op Pain: mild  Post-op Assessment: Post-op Vital signs reviewed  Post-op Vital Signs: stable  Complications: No apparent anesthesia complications

## 2012-11-14 ENCOUNTER — Encounter (HOSPITAL_COMMUNITY): Payer: Self-pay | Admitting: Orthopedic Surgery

## 2014-10-02 ENCOUNTER — Ambulatory Visit (INDEPENDENT_AMBULATORY_CARE_PROVIDER_SITE_OTHER): Payer: 59 | Admitting: Neurology

## 2014-10-02 ENCOUNTER — Encounter: Payer: Self-pay | Admitting: Neurology

## 2014-10-02 ENCOUNTER — Other Ambulatory Visit: Payer: Self-pay | Admitting: Neurology

## 2014-10-02 VITALS — BP 146/82 | HR 63 | Ht 75.0 in | Wt 203.2 lb

## 2014-10-02 DIAGNOSIS — R51 Headache: Secondary | ICD-10-CM | POA: Diagnosis not present

## 2014-10-02 DIAGNOSIS — R519 Headache, unspecified: Secondary | ICD-10-CM | POA: Insufficient documentation

## 2014-10-02 MED ORDER — VENLAFAXINE HCL ER 37.5 MG PO CP24: ORAL_CAPSULE | ORAL | Status: DC

## 2014-10-02 MED ORDER — TRAMADOL HCL 50 MG PO TABS: 50.0000 mg | ORAL_TABLET | Freq: Four times a day (QID) | ORAL | Status: DC | PRN

## 2014-10-02 NOTE — Patient Instructions (Addendum)
We will check MRI evaluation of the brain, and we will start a medication called Effexor for the headache. If you do not tolerate the medication, or you need a higher dose, please contact our office. We will give you a prescription called Ultram to take if needed for the headache. We will set you up for blood work today, follow-up in the office in 2-3 months.   Headaches, Frequently Asked Questions MIGRAINE HEADACHES Q: What is migraine? What causes it? How can I treat it? A: Generally, migraine headaches begin as a dull ache. Then they develop into a constant, throbbing, and pulsating pain. You may experience pain at the temples. You may experience pain at the front or back of one or both sides of the head. The pain is usually accompanied by a combination of:  Nausea.  Vomiting.  Sensitivity to light and noise. Some people (about 15%) experience an aura (see below) before an attack. The cause of migraine is believed to be chemical reactions in the brain. Treatment for migraine may include over-the-counter or prescription medications. It may also include self-help techniques. These include relaxation training and biofeedback.  Q: What is an aura? A: About 15% of people with migraine get an "aura". This is a sign of neurological symptoms that occur before a migraine headache. You may see wavy or jagged lines, dots, or flashing lights. You might experience tunnel vision or blind spots in one or both eyes. The aura can include visual or auditory hallucinations (something imagined). It may include disruptions in smell (such as strange odors), taste or touch. Other symptoms include:  Numbness.  A "pins and needles" sensation.  Difficulty in recalling or speaking the correct word. These neurological events may last as long as 60 minutes. These symptoms will fade as the headache begins. Q: What is a trigger? A: Certain physical or environmental factors can lead to or "trigger" a migraine. These  include:  Foods.  Hormonal changes.  Weather.  Stress. It is important to remember that triggers are different for everyone. To help prevent migraine attacks, you need to figure out which triggers affect you. Keep a headache diary. This is a good way to track triggers. The diary will help you talk to your healthcare professional about your condition. Q: Does weather affect migraines? A: Bright sunshine, hot, humid conditions, and drastic changes in barometric pressure may lead to, or "trigger," a migraine attack in some people. But studies have shown that weather does not act as a trigger for everyone with migraines. Q: What is the link between migraine and hormones? A: Hormones start and regulate many of your body's functions. Hormones keep your body in balance within a constantly changing environment. The levels of hormones in your body are unbalanced at times. Examples are during menstruation, pregnancy, or menopause. That can lead to a migraine attack. In fact, about three quarters of all women with migraine report that their attacks are related to the menstrual cycle.  Q: Is there an increased risk of stroke for migraine sufferers? A: The likelihood of a migraine attack causing a stroke is very remote. That is not to say that migraine sufferers cannot have a stroke associated with their migraines. In persons under age 50, the most common associated factor for stroke is migraine headache. But over the course of a person's normal life span, the occurrence of migraine headache may actually be associated with a reduced risk of dying from cerebrovascular disease due to stroke.  Q: What are  acute medications for migraine? A: Acute medications are used to treat the pain of the headache after it has started. Examples over-the-counter medications, NSAIDs, ergots, and triptans.  Q: What are the triptans? A: Triptans are the newest class of abortive medications. They are specifically targeted to treat  migraine. Triptans are vasoconstrictors. They moderate some chemical reactions in the brain. The triptans work on receptors in your brain. Triptans help to restore the balance of a neurotransmitter called serotonin. Fluctuations in levels of serotonin are thought to be a main cause of migraine.  Q: Are over-the-counter medications for migraine effective? A: Over-the-counter, or "OTC," medications may be effective in relieving mild to moderate pain and associated symptoms of migraine. But you should see your caregiver before beginning any treatment regimen for migraine.  Q: What are preventive medications for migraine? A: Preventive medications for migraine are sometimes referred to as "prophylactic" treatments. They are used to reduce the frequency, severity, and length of migraine attacks. Examples of preventive medications include antiepileptic medications, antidepressants, beta-blockers, calcium channel blockers, and NSAIDs (nonsteroidal anti-inflammatory drugs). Q: Why are anticonvulsants used to treat migraine? A: During the past few years, there has been an increased interest in antiepileptic drugs for the prevention of migraine. They are sometimes referred to as "anticonvulsants". Both epilepsy and migraine may be caused by similar reactions in the brain.  Q: Why are antidepressants used to treat migraine? A: Antidepressants are typically used to treat people with depression. They may reduce migraine frequency by regulating chemical levels, such as serotonin, in the brain.  Q: What alternative therapies are used to treat migraine? A: The term "alternative therapies" is often used to describe treatments considered outside the scope of conventional Western medicine. Examples of alternative therapy include acupuncture, acupressure, and yoga. Another common alternative treatment is herbal therapy. Some herbs are believed to relieve headache pain. Always discuss alternative therapies with your caregiver  before proceeding. Some herbal products contain arsenic and other toxins. TENSION HEADACHES Q: What is a tension-type headache? What causes it? How can I treat it? A: Tension-type headaches occur randomly. They are often the result of temporary stress, anxiety, fatigue, or anger. Symptoms include soreness in your temples, a tightening band-like sensation around your head (a "vice-like" ache). Symptoms can also include a pulling feeling, pressure sensations, and contracting head and neck muscles. The headache begins in your forehead, temples, or the back of your head and neck. Treatment for tension-type headache may include over-the-counter or prescription medications. Treatment may also include self-help techniques such as relaxation training and biofeedback. CLUSTER HEADACHES Q: What is a cluster headache? What causes it? How can I treat it? A: Cluster headache gets its name because the attacks come in groups. The pain arrives with little, if any, warning. It is usually on one side of the head. A tearing or bloodshot eye and a runny nose on the same side of the headache may also accompany the pain. Cluster headaches are believed to be caused by chemical reactions in the brain. They have been described as the most severe and intense of any headache type. Treatment for cluster headache includes prescription medication and oxygen. SINUS HEADACHES Q: What is a sinus headache? What causes it? How can I treat it? A: When a cavity in the bones of the face and skull (a sinus) becomes inflamed, the inflammation will cause localized pain. This condition is usually the result of an allergic reaction, a tumor, or an infection. If your headache is caused by a  sinus blockage, such as an infection, you will probably have a fever. An x-ray will confirm a sinus blockage. Your caregiver's treatment might include antibiotics for the infection, as well as antihistamines or decongestants.  REBOUND HEADACHES Q: What is a  rebound headache? What causes it? How can I treat it? A: A pattern of taking acute headache medications too often can lead to a condition known as "rebound headache." A pattern of taking too much headache medication includes taking it more than 2 days per week or in excessive amounts. That means more than the label or a caregiver advises. With rebound headaches, your medications not only stop relieving pain, they actually begin to cause headaches. Doctors treat rebound headache by tapering the medication that is being overused. Sometimes your caregiver will gradually substitute a different type of treatment or medication. Stopping may be a challenge. Regularly overusing a medication increases the potential for serious side effects. Consult a caregiver if you regularly use headache medications more than 2 days per week or more than the label advises. ADDITIONAL QUESTIONS AND ANSWERS Q: What is biofeedback? A: Biofeedback is a self-help treatment. Biofeedback uses special equipment to monitor your body's involuntary physical responses. Biofeedback monitors:  Breathing.  Pulse.  Heart rate.  Temperature.  Muscle tension.  Brain activity. Biofeedback helps you refine and perfect your relaxation exercises. You learn to control the physical responses that are related to stress. Once the technique has been mastered, you do not need the equipment any more. Q: Are headaches hereditary? A: Four out of five (80%) of people that suffer report a family history of migraine. Scientists are not sure if this is genetic or a family predisposition. Despite the uncertainty, a child has a 50% chance of having migraine if one parent suffers. The child has a 75% chance if both parents suffer.  Q: Can children get headaches? A: By the time they reach high school, most [place] people have experienced some type of headache. Many safe and effective approaches or medications can prevent a headache from occurring or stop it  after it has begun.  Q: What type of doctor should I see to diagnose and treat my headache? A: Start with your primary caregiver. Discuss his or her experience and approach to headaches. Discuss methods of classification, diagnosis, and treatment. Your caregiver may decide to recommend you to a headache specialist, depending upon your symptoms or other physical conditions. Having diabetes, allergies, etc., may require a more comprehensive and inclusive approach to your headache. The National Headache Foundation will provide, upon request, a list of [facility] physician members in your state. Document Released: 05/02/2003 Document Revised: 05/04/2011 Document Reviewed: 10/10/2007 [facility] Patient Information 2015 [place], [place]. This information is not intended to replace advice given to you by your health care provider. Make sure you discuss any questions you have with your health care provider.

## 2014-10-02 NOTE — Progress Notes (Signed)
Reason for visit: Headache  Referring physician: Dr. Catha Gosselin  Bernard Gonzalez is a 64 y.o. male  History of present illness:  Mr. Bernard Gonzalez is a 64 year old left-handed white male with a 2-1/2 month history of headaches. He claims that prior to this, he did not have headaches at any point in his life. The patient began having bitemporal and frontal headaches with headaches on the top of the head on occasion. He describes the headaches as a blend between a throbbing and a pressure sensation. The headaches vary in severity from one point in the day to the next, sometimes worse in the morning, sometimes worse in the evening. At times, he is unable to function, he has to close his eyes and put his head down. He does have photophobia with the headache, no phonophobia. He denies a visual field changes, he may have nausea but no vomiting. He denies numbness or tingling on the face, arms, or legs. He denies any weakness of the extremities or difficulty with balance and no problems with control of the bowels or the bladder. He denies any neck stiffness with the headache. He denies any allergy symptoms, or sinus drainage. He has not had any other systemic complaints such as weight loss, malaise, or night sweats. The patient has been taking Aleve for the headache, but this upsets his stomach. He has switched to Tylenol migraine. The patient is sent to this office for further evaluation. He indicates that he has been under stress recently, he is taking care of his mother-in-law and his mother, and trying to maintain 2 different households.  Past Medical History  Diagnosis Date  . Medical history non-contributory   . Wears glasses     Past Surgical History  Procedure Laterality Date  . Tibia fracture surgery  1969    rt  . Tonsillectomy    . Mass excision Left 07/28/2012    Procedure: EXCISION chronic cyst left buttock;  Surgeon: Shelly Rubenstein, MD;  Location: [facility];  Service:  General;  Laterality: Left;  . Total shoulder arthroplasty Left 11/10/2012    Procedure: TOTAL SHOULDER ARTHROPLASTY;  Surgeon: Mable Paris, MD;  Location: [facility] OR;  Service: Orthopedics;  Laterality: Left;  Left total shoulder replacement    Family History  Problem Relation Age of Onset  . Thyroid disease Mother   . Pneumonia Father   . Migraines Neg Hx     Social history:  reports that he quit smoking about 4 months ago. His smoking use included Cigarettes. He smoked 1.00 pack per day. He has never used smokeless tobacco. He reports that he drinks alcohol. He reports that he does not use illicit drugs.  Medications:  Prior to Admission medications   Medication Sig Start Date End Date Taking? Authorizing Provider  aspirin-acetaminophen-caffeine (EXCEDRIN MIGRAINE) [phone] MG per tablet Take 1 tablet by mouth every 6 (six) hours as needed for headache.    Historical Provider, MD  calcium-vitamin D (OSCAL) 250-125 MG-UNIT per tablet Take 1 tablet by mouth daily.    Historical Provider, MD  Cholecalciferol (VITAMIN D) 2000 UNITS CAPS Take 1 capsule by mouth daily.    Historical Provider, MD  oxyCODONE-acetaminophen (ROXICET) 5-325 MG per tablet Take 1-2 tablets by mouth every 4 (four) hours as needed for pain. 11/10/12   Jiles Harold, PA-C     No Known Allergies  ROS:  Out of a complete 14 system review of symptoms, the patient complains only of the following  symptoms, and all other reviewed systems are negative.  Snoring Headache  Blood pressure 146/82, pulse 63, height  (1.905 m), weight 203 lb 3.2 oz (92.171 kg).  Physical Exam  General: The patient is alert and cooperative at the time of the examination.  Eyes: Pupils are equal, round, and reactive to light. Discs are flat bilaterally.  Neck: The neck is supple, no carotid bruits are noted.  Respiratory: The respiratory examination is clear.  Cardiovascular: The cardiovascular examination reveals a  regular rate and rhythm, no obvious murmurs or rubs are noted.  Neuromuscular: Range of movement of the cervical spine is full, the patient has no crepitus in the temporomandibular joints.  Skin: Extremities are without significant edema.  Neurologic Exam  Mental status: The patient is alert and oriented x 3 at the time of the examination. The patient has apparent normal recent and remote memory, with an apparently normal attention span and concentration ability.  Cranial nerves: Facial symmetry is present. There is good sensation of the face to pinprick and soft touch bilaterally. The strength of the facial muscles and the muscles to head turning and shoulder shrug are normal bilaterally. Speech is well enunciated, no aphasia or dysarthria is noted. Extraocular movements are full. Visual fields are full. The tongue is midline, and the patient has symmetric elevation of the soft palate. No obvious hearing deficits are noted.  Motor: The motor testing reveals 5 over 5 strength of all 4 extremities. Good symmetric motor tone is noted throughout.  Sensory: Sensory testing is intact to pinprick, soft touch, vibration sensation, and position sense on all 4 extremities. No evidence of extinction is noted.  Coordination: Cerebellar testing reveals good finger-nose-finger and heel-to-shin bilaterally.  Gait and station: Gait is normal. Tandem gait is normal. Romberg is negative. No drift is seen.  Reflexes: Deep tendon reflexes are symmetric and normal bilaterally. Toes are downgoing bilaterally.   Assessment/Plan:  1. Chronic daily headache  The patient has new onset headache. The patient will be set up for MRI evaluation of the brain, and blood work to include a sedimentation rate. He will be placed on Effexor, and he will be given Ultram to take if needed for the severe headache. The patient may continue the Tylenol. He will follow-up in 3 months. He will contact our office if he is not  tolerating the medication, or he requires a dose adjustment.  Marlan Palau MD 10/02/2014 9:06 PM  [facility] [address] [place], [place] [address]  Phone [phone] Fax [phone]

## 2014-10-03 ENCOUNTER — Ambulatory Visit (INDEPENDENT_AMBULATORY_CARE_PROVIDER_SITE_OTHER): Payer: 59

## 2014-10-03 DIAGNOSIS — R51 Headache: Secondary | ICD-10-CM

## 2014-10-03 DIAGNOSIS — R519 Headache, unspecified: Secondary | ICD-10-CM

## 2014-10-03 LAB — C-REACTIVE PROTEIN: CRP: 2.8 mg/L (ref 0.0–4.9)

## 2014-10-03 LAB — SEDIMENTATION RATE: Sed Rate: 4 mm/hr

## 2014-10-04 ENCOUNTER — Telehealth: Payer: Self-pay

## 2014-10-04 NOTE — Telephone Encounter (Signed)
Left voicemail asking the patient to call back for his lab results.

## 2014-10-04 NOTE — Telephone Encounter (Signed)
Patient returned your call.

## 2014-10-04 NOTE — Telephone Encounter (Signed)
I called the patient and informed him that his blood work was normal.

## 2014-10-04 NOTE — Telephone Encounter (Signed)
-----   Message from York Spaniel, MD sent at 10/03/2014  7:51 PM EDT -----  The blood work results are unremarkable. Please call the patient.  ----- Message -----    From: [facility] Lab Results In Interface    Sent: 10/03/2014   7:41 PM      To: York Spaniel, MD

## 2014-10-07 ENCOUNTER — Telehealth: Payer: Self-pay | Admitting: Neurology

## 2014-10-07 NOTE — Telephone Encounter (Signed)
I called patient. The MRI the brain shows minimal nonspecific white matter changes, could be consistent with a history of migraine. No significant abnormalities were seen.   MRI brain 10/05/2014:  IMPRESSION:  Mildly abnormal MRI brain (with and without) demonstrating: 1. Scattered small round periventricular and subcortical T2 hyperintensities. These findings are non-specific and considerations include autoimmune, inflammatory, post-infectious, microvascular ischemic or migraine associated etiologies. 2. No acute findings.

## 2014-10-08 ENCOUNTER — Telehealth: Payer: Self-pay | Admitting: Neurology

## 2014-10-08 MED ORDER — KETOROLAC TROMETHAMINE 10 MG PO TABS: 10.0000 mg | ORAL_TABLET | Freq: Four times a day (QID) | ORAL | Status: DC | PRN

## 2014-10-08 NOTE — Telephone Encounter (Signed)
I called the patient. He stated the Effexor is not helping his headache. He also stated that the Tramadol makes him nauseous, sleepy and sweaty. He wondered if there is an alternative medication he could try to avoid these side effects.

## 2014-10-08 NOTE — Telephone Encounter (Signed)
Pt called and states that his medication traMADol (ULTRAM) 50 MG tablet is not working, he has nausea and sweats while taking it. Please call and advise [phone]

## 2014-10-08 NOTE — Telephone Encounter (Signed)
I called patient. He is not tolerating the Ultram, I will try Toradol tablets instead to see if this works better. He will stay on Effexor for now.

## 2014-11-05 ENCOUNTER — Telehealth: Payer: Self-pay | Admitting: Neurology

## 2014-11-05 MED ORDER — VENLAFAXINE HCL ER 75 MG PO CP24: ORAL_CAPSULE | ORAL | Status: DC

## 2014-11-05 MED ORDER — DICLOFENAC SODIUM 75 MG PO TBEC: 75.0000 mg | DELAYED_RELEASE_TABLET | Freq: Two times a day (BID) | ORAL | Status: DC | PRN

## 2014-11-05 NOTE — Telephone Encounter (Signed)
I called the patient. He states that the Toradol is not helping his headaches. He states it doesn't matter if he takes the medication or not, his headaches are the same. He does not have side effects from the Toradol like he did with Tramadol. He would like to know if there is anything else he could try to help control his headaches.

## 2014-11-05 NOTE — Telephone Encounter (Signed)
Patient called regarding ketorolac (TORADOL) 10 MG tablet. Patient doesn't feel that it's doing anything. Pt states Dr. Anne Hahn had him on another medication prior to ketorolac but it had side effects of sleepiness and nausea. Patient wonders if there is anything else Dr. Anne Hahn could prescribe.

## 2014-11-05 NOTE — Telephone Encounter (Signed)
I called the patient. The ketorolac was of no benefit. The Toradol cause drowsiness and nausea. He still is on a low dose of the Effexor taking 75 mg daily. I will double this dose and then go to 225 mg after 2 weeks. If he is getting no benefit whatsoever, he is to contact our office, we will consider switching him to another medication such as propranolol.

## 2015-01-02 ENCOUNTER — Encounter: Payer: Self-pay | Admitting: Neurology

## 2015-01-02 ENCOUNTER — Ambulatory Visit (INDEPENDENT_AMBULATORY_CARE_PROVIDER_SITE_OTHER): Payer: 59 | Admitting: Neurology

## 2015-01-02 VITALS — BP 140/81 | HR 66 | Ht 75.0 in | Wt 206.5 lb

## 2015-01-02 DIAGNOSIS — R51 Headache: Secondary | ICD-10-CM

## 2015-01-02 DIAGNOSIS — R519 Headache, unspecified: Secondary | ICD-10-CM

## 2015-01-02 MED ORDER — TOPIRAMATE 25 MG PO TABS: ORAL_TABLET | ORAL | Status: DC

## 2015-01-02 NOTE — Progress Notes (Signed)
Reason for visit: Headaches  Bernard Gonzalez is an 64 y.o. male  History of present illness:  Bernard Gonzalez is a [age] year old left-handed white male with a history of daily headaches. The headaches began in May or June 2016, the headaches have been daily since that time, some days are worse than others. The headaches may be bad in the morning or come on later in the day. The patient has not been able to tolerate Ultram or Torodol for the headache. He is taking Excedrin Migraine, this seems to help some. He drinks 2 cups of coffee in the morning, he will usually have at least one iced tea in the afternoon, and occasionally a soft drink. The patient has been placed on Effexor, but this has not been effective in treating the headache. The patient is on a maximum dose at this time. He returns for further evaluation. The headaches may become incapacitating 2 or 3 times a week. He denies any neck stiffness with the headache, he may have some nausea, but no vomiting. The patient returns to the office today for further evaluation.  Past Medical History  Diagnosis Date  . Medical history non-contributory   . Wears glasses     Past Surgical History  Procedure Laterality Date  . Tibia fracture surgery  1969    rt  . Tonsillectomy    . Mass excision Left 07/28/2012    Procedure: EXCISION chronic cyst left buttock;  Surgeon: Shelly Rubensteinouglas A Blackman, MD;  Location: [facility];  Service: General;  Laterality: Left;  . 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 Thyroid disease Mother   . Pneumonia Father   . Migraines Neg Hx     Social history:  reports that he quit smoking about 7 months ago. His smoking use included Cigarettes. He smoked 1.00 pack per day. He has never  used smokeless tobacco. He reports that he drinks alcohol. He reports that he does not use illicit drugs.   No Known Allergies  Medications:  Prior to Admission medications   Medication Sig Start Date End Date Taking? Authorizing Provider  aspirin 500 MG EC tablet Take 500 mg by mouth every 6 (six) hours as needed for pain.   Yes Historical Provider, MD  diclofenac (VOLTAREN) 75 MG EC tablet Take 1 tablet (75 mg total) by mouth 2 (two) times daily as needed. 11/05/14  Yes York Spanielharles K Willis, MD  venlafaxine XR (EFFEXOR XR) 75 MG 24 hr capsule 2 tablets daily for 2 weeks, then take 3 tablets daily 11/05/14  Yes York Spanielharles K Willis, MD    ROS:  Out of a complete 14 system review of symptoms, the patient complains only of the following symptoms, and all other reviewed systems are negative.  Headache Snoring  Blood pressure 140/81, pulse 66, height 6\' 3"  (1.905 m), weight 206 lb 8 oz (93.668 kg).  Physical Exam  General: The patient is alert and cooperative at the time of the examination.  Skin: No significant peripheral edema is noted.   Neurologic Exam  Mental status: The patient is alert and oriented x 3 at the time of the examination. The patient has apparent normal recent and remote memory, with an apparently normal attention span and concentration ability.   Cranial  nerves: Facial symmetry is present. Speech is normal, no aphasia or dysarthria is noted. Extraocular movements are full. Visual fields are full.  Motor: The patient has good strength in all 4 extremities.  Sensory examination: Soft touch sensation is symmetric on the face, arms, and legs.  Coordination: The patient has good finger-nose-finger and heel-to-shin bilaterally.  Gait and station: The patient has a normal gait. Tandem gait is normal. Romberg is negative. No drift is seen.  Reflexes: Deep tendon reflexes are symmetric.   MRI brain 10/05/2014:  IMPRESSION:  Mildly abnormal MRI brain (with and without)  demonstrating: 1. Scattered small round periventricular and subcortical T2 hyperintensities. These findings are non-specific and considerations include autoimmune, inflammatory, post-infectious, microvascular ischemic or migraine associated etiologies. 2. No acute findings.   * MRI scan images were reviewed online. I agree with the written report.    Assessment/Plan:  1. Intractable headache  The patient has had MRI evaluation of the brain that was relatively unremarkable, and a sedimentation rate was normal. The patient as not responded to Effexor, we will add Topamax to this. The patient will contact our office if the medication is not tolerated or the headaches do not abate. He will follow-up in 3 months.  Marlan Palau MD 01/02/2015 7:09 PM  [facility] [address] [place], [place] [address]  Phone [phone] Fax [phone]

## 2015-01-02 NOTE — Patient Instructions (Signed)
 We will start Topamax for the headache.  Topamax (topiramate) is a seizure medication that has an FDA approval for seizures and for migraine headache. Potential side effects of this medication include weight loss, cognitive slowing, tingling in the fingers and toes, and carbonated drinks will taste bad. If any significant side effects are noted on this drug, please contact our office.  Migraine Headache A migraine headache is an intense, throbbing pain on one or both sides of your head. A migraine can last for 30 minutes to several hours. CAUSES  The exact cause of a migraine headache is not always known. However, a migraine may be caused when nerves in the brain become irritated and release chemicals that cause inflammation. This causes pain. Certain things may also trigger migraines, such as:  Alcohol.  Smoking.  Stress.  Menstruation.  Aged cheeses.  Foods or drinks that contain nitrates, glutamate, aspartame, or tyramine.  Lack of sleep.  Chocolate.  Caffeine.  Hunger.  Physical exertion.  Fatigue.  Medicines used to treat chest pain (nitroglycerine), birth control pills, estrogen, and some blood pressure medicines. SIGNS AND SYMPTOMS  Pain on one or both sides of your head.  Pulsating or throbbing pain.  Severe pain that prevents daily activities.  Pain that is aggravated by any physical activity.  Nausea, vomiting, or both.  Dizziness.  Pain with exposure to bright lights, loud noises, or activity.  General sensitivity to bright lights, loud noises, or smells. Before you get a migraine, you may get warning signs that a migraine is coming (aura). An aura may include:  Seeing flashing lights.  Seeing bright spots, halos, or zigzag lines.  Having tunnel vision or blurred vision.  Having feelings of numbness or tingling.  Having trouble talking.  Having muscle weakness. DIAGNOSIS  A migraine headache is often diagnosed based  on:  Symptoms.  Physical exam.  A CT scan or MRI of your head. These imaging tests cannot diagnose migraines, but they can help rule out other causes of headaches. TREATMENT Medicines may be given for pain and nausea. Medicines can also be given to help prevent recurrent migraines.  HOME CARE INSTRUCTIONS  Only take over-the-counter or prescription medicines for pain or discomfort as directed by your health care provider. The use of long-term narcotics is not recommended.  Lie down in a dark, quiet room when you have a migraine.  Keep a journal to find out what may trigger your migraine headaches. For example, write down:  What you eat and drink.  How much sleep you get.  Any change to your diet or medicines.  Limit alcohol consumption.  Quit smoking if you smoke.  Get 7-9 hours of sleep, or as recommended by your health care provider.  Limit stress.  Keep lights dim if bright lights bother you and make your migraines worse. SEEK IMMEDIATE MEDICAL CARE IF:   Your migraine becomes severe.  You have a fever.  You have a stiff neck.  You have vision loss.  You have muscular weakness or loss of muscle control.  You start losing your balance or have trouble walking.  You feel faint or pass out.  You have severe symptoms that are different from your first symptoms. MAKE SURE YOU:   Understand these instructions.  Will watch your condition.  Will get help right away if you are not doing well or get worse.   This information is not intended to replace advice given to you by your health care provider. Make sure   you discuss any questions you have with your health care provider.   Document Released: 02/09/2005 Document Revised: 03/02/2014 Document Reviewed: 10/17/2012 Elsevier Interactive Patient Education 2016 Elsevier Inc.  

## 2015-01-17 ENCOUNTER — Other Ambulatory Visit: Payer: Self-pay | Admitting: Neurology

## 2015-04-04 ENCOUNTER — Encounter: Payer: Self-pay | Admitting: Adult Health

## 2015-04-04 ENCOUNTER — Ambulatory Visit (INDEPENDENT_AMBULATORY_CARE_PROVIDER_SITE_OTHER): Payer: Medicare Other | Admitting: Adult Health

## 2015-04-04 VITALS — BP 133/85 | HR 82 | Resp 20 | Ht 75.0 in | Wt 200.0 lb

## 2015-04-04 DIAGNOSIS — G43019 Migraine without aura, intractable, without status migrainosus: Secondary | ICD-10-CM | POA: Diagnosis not present

## 2015-04-04 MED ORDER — PROPRANOLOL HCL 10 MG PO TABS: ORAL_TABLET | ORAL | Status: DC

## 2015-04-04 NOTE — Progress Notes (Signed)
PATIENT: Bernard Gonzalez DOB: 10-24-1950  REASON FOR VISIT: follow up-  headache HISTORY FROM: patient  HISTORY OF PRESENT ILLNESS:  Bernard Gonzalez is a 65 year old male with a history of daily headaches.  He returns today for follow-up. The patient states that after starting Topamax he did not see any benefit in his headaches. He continues to have headaches daily. He states that the headache may occur throughout the day. He states the headache normally starts  In the frontal region and radiates to the occipital region. The patient does have phonophobia but not photophobia. He states he will have nausea but not vomiting. He describes his headache as a throbbing/squeezing sensation. The patient's blood pressure is elevated today however he feels is related to a conversation with his mother before his visit. The patient would like to talk about different medication options to help with his headaches. He denies any new neurological symptoms. He returns today for an evaluation.  HISTORY 01/02/15: Bernard Gonzalez is a 65 year old left-handed white male with a history of daily headaches. The headaches began in May or June 2016, the headaches have been daily since that time, some days are worse than others. The headaches may be bad in the morning or come on later in the day. The patient has not been able to tolerate Ultram or Torodol for the headache. He is taking Excedrin Migraine, this seems to help some. He drinks 2 cups of coffee in the morning, he will usually have at least one iced tea in the afternoon, and occasionally a soft drink. The patient has been placed on Effexor, but this has not been effective in treating the headache. The patient is on a maximum dose at this time. He returns for further evaluation. The headaches may become incapacitating 2 or 3 times a week. He denies any neck stiffness with the headache, he may have some nausea, but no vomiting. The patient returns to the office today for further  evaluation.  REVIEW OF SYSTEMS: Out of a complete 14 system review of symptoms, the patient complains only of the following symptoms, and all other reviewed systems are negative.   see history of present illness  ALLERGIES: No Known Allergies  HOME MEDICATIONS: Outpatient Prescriptions Prior to Visit  Medication Sig Dispense Refill  . aspirin 500 MG EC tablet Take 500 mg by mouth every 6 (six) hours as needed for pain.    Marland Kitchen diclofenac (VOLTAREN) 75 MG EC tablet Take 1 tablet (75 mg total) by mouth 2 (two) times daily as needed. 60 tablet 1  . topiramate (TOPAMAX) 25 MG tablet Take one tablet at night for one week, then take 2 tablets at night for one week, then take 3 tablets at night. 90 tablet 3  . venlafaxine XR (EFFEXOR-XR) 75 MG 24 hr capsule TAKE 2 CAPSULES BY MOUTH DAILY FOR 2 WEEKS, THEN TAKE 3 CAPSULES DAILY 90 capsule 3   No facility-administered medications prior to visit.    PAST MEDICAL HISTORY: Past Medical History  Diagnosis Date  . Medical history non-contributory   . Wears glasses     PAST SURGICAL HISTORY: Past Surgical History  Procedure Laterality Date  . Tibia fracture surgery  1969    rt  . Tonsillectomy    . Mass excision Left 07/28/2012    Procedure: EXCISION chronic cyst left buttock;  Surgeon: Shelly Rubenstein, MD;  Location: [facility];  Service: General;  Laterality: Left;  . 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 Thyroid disease Mother   . Pneumonia Father   . Migraines Neg Hx     SOCIAL HISTORY: Social History   Social History  . Marital Status: Married    Spouse Name: N/A  . Number of Children: 5  . Years of Education: 16   Occupational History  . retired    Social  History Main Topics  . Smoking status: Former Smoker -- 1.00 packs/day    Types: Cigarettes    Quit date: 05/25/2014  . Smokeless tobacco: Never Used     Comment: stppoed 07/03/12  . Alcohol Use: 0.0 oz/week    0 Standard drinks or equivalent per week     Comment: occasionally  . Drug Use: No  . Sexual Activity: Not on file     Comment: was smoking 1 pk week   Other Topics Concern  . Not on file   Social History Narrative   Patient drinks 2 cups of caffeine daily.   Patient is left handed.      PHYSICAL EXAM  Filed Vitals:   04/04/15 0949 04/04/15 1000  BP: 184/98 133/85  Pulse: 82   Resp: 20   Height:  (1.905 m)   Weight: 200 lb (90.719 kg)    Body mass index is 25 kg/(m^2).  Generalized: Well developed, in no acute distress   Neurological examination  Mentation: Alert oriented to time, place, history taking. Follows all commands speech and language fluent Cranial nerve II-XII: Pupils were equal round reactive to light. Extraocular movements were full, visual field were full on confrontational test. Facial sensation and strength were normal. Uvula tongue midline. Head turning and shoulder shrug  were normal and symmetric. Motor: The motor testing reveals 5 over 5 strength of all 4 extremities. Good symmetric motor tone is noted throughout.  Sensory: Sensory testing is intact to soft touch on all 4 extremities. No evidence of extinction is noted.  Coordination: Cerebellar testing reveals good finger-nose-finger and heel-to-shin bilaterally.  Gait and station: Gait is normal. Tandem gait is normal. Romberg is negative. No drift is seen.  Reflexes: Deep tendon reflexes are symmetric and normal bilaterally.   DIAGNOSTIC DATA (LABS, IMAGING, TESTING) - I reviewed patient records, labs, notes, testing and imaging myself where available.      ASSESSMENT AND PLAN 65 y.o. year old male  has a past medical history of Medical history non-contributory and Wears glasses.  here with:   1. Migraine headache   The patient has not received any benefit since starting Topamax. He continues to have daily headaches. Topamax will be discontinued and he will be started on propranolol. He will begin by taking 10 mg BID for 2 weeks then increase to 20 mg BID thereafter. I reviewed the side effects with the patient. He will remain on  Effexor. Patient advised that if his headache frequency or severity does not improve with propranolol he should let us know. He will follow-up in 3-4 months or sooner if needed.     Butch Penny, MSN, NP-C 04/04/2015, 4:16 PM [facility] [address], [address] [place], [place] [address] [phone]

## 2015-04-04 NOTE — Patient Instructions (Addendum)
Start Inderal 10 mg 1 tablet twice a day for 2 weeks then increase to 2 tablets twice a day thereafter If your symptoms worsen or you develop new symptoms please let us know.  Propranolol tablets What is this medicine? PROPRANOLOL (proe PRAN oh lole) is a beta-blocker. Beta-blockers reduce the workload on the heart and help it to beat more regularly. This medicine is used to treat high blood pressure, to control irregular heart rhythms (arrhythmias) and to relieve chest pain caused by angina. It may also be helpful after a heart attack. This medicine is also used to prevent migraine headaches, relieve uncontrollable shaking (tremors), and help certain problems related to the thyroid gland and adrenal gland. This medicine may be used for other purposes; ask your health care provider or pharmacist if you have questions. What should I tell my health care provider before I take this medicine? They need to know if you have any of these conditions: -circulation problems or blood vessel disease -diabetes -history of heart attack or heart disease, vasospastic angina -kidney disease -liver disease -lung or breathing disease, like asthma or emphysema -pheochromocytoma -slow heart rate -thyroid disease -an unusual or allergic reaction to propranolol, other beta-blockers, medicines, foods, dyes, or preservatives -pregnant or trying to get pregnant -breast-feeding How should I use this medicine? Take this medicine by mouth with a glass of water. Follow the directions on the prescription label. Take your doses at regular intervals. Do not take your medicine more often than directed. Do not stop taking except on your the advice of your doctor or health care professional. Talk to your pediatrician regarding the use of this medicine in children. Special care may be needed. Overdosage: If you think you have taken too much of this medicine contact a poison control center or emergency room at once. NOTE: This  medicine is only for you. Do not share this medicine with others. What if I miss a dose? If you miss a dose, take it as soon as you can. If it is almost time for your next dose, take only that dose. Do not take double or extra doses. What may interact with this medicine? Do not take this medicine with any of the following medications: -feverfew -phenothiazines like chlorpromazine, mesoridazine, prochlorperazine, thioridazine This medicine may also interact with the following medications: -aluminum hydroxide gel -antipyrine -antiviral medicines for HIV or AIDS -barbiturates like phenobarbital -certain medicines for blood pressure, heart disease, irregular heart beat -cimetidine -ciprofloxacin -diazepam -fluconazole -haloperidol -isoniazid -medicines for cholesterol like cholestyramine or colestipol -medicines for mental depression -medicines for migraine headache like almotriptan, eletriptan, frovatriptan, naratriptan, rizatriptan, sumatriptan, zolmitriptan -NSAIDs, medicines for pain and inflammation, like ibuprofen or naproxen -phenytoin -rifampin -teniposide -theophylline -thyroid medicines -tolbutamide -warfarin -zileuton This list may not describe all possible interactions. Give your health care provider a list of all the medicines, herbs, non-prescription drugs, or dietary supplements you use. Also tell them if you smoke, drink alcohol, or use illegal drugs. Some items may interact with your medicine. What should I watch for while using this medicine? Visit your doctor or health care professional for regular check ups. Check your blood pressure and pulse rate regularly. Ask your health care professional what your blood pressure and pulse rate should be, and when you should contact them. You may get drowsy or dizzy. Do not drive, use machinery, or do anything that needs mental alertness until you know how this drug affects you. Do not stand or sit up quickly, especially if you  are  an older patient. This reduces the risk of dizzy or fainting spells. Alcohol can make you more drowsy and dizzy. Avoid alcoholic drinks. This medicine can affect blood sugar levels. If you have diabetes, check with your doctor or health care professional before you change your diet or the dose of your diabetic medicine. Do not treat yourself for coughs, colds, or pain while you are taking this medicine without asking your doctor or health care professional for advice. Some ingredients may increase your blood pressure. What side effects may I notice from receiving this medicine? Side effects that you should report to your doctor or health care professional as soon as possible: -allergic reactions like skin rash, itching or hives, swelling of the face, lips, or tongue -breathing problems -changes in blood sugar -cold hands or feet -difficulty sleeping, nightmares -dry peeling skin -hallucinations -muscle cramps or weakness -slow heart rate -swelling of the legs and ankles -vomiting Side effects that usually do not require medical attention (report to your doctor or health care professional if they continue or are bothersome): -change in sex drive or performance -diarrhea -dry sore eyes -hair loss -nausea -weak or tired This list may not describe all possible side effects. Call your doctor for medical advice about side effects. You may report side effects to FDA at 1-800-FDA-1088. Where should I keep my medicine? Keep out of the reach of children. Store at room temperature between 15 and 30 degrees C (59 and 86 degrees F). Protect from light. Throw away any unused medicine after the expiration date. NOTE: This sheet is a summary. It may not cover all possible information. If you have questions about this medicine, talk to your doctor, pharmacist, or health care provider.    2016, Elsevier/Gold Standard. (2012-10-14 14:51:53)

## 2015-04-04 NOTE — Progress Notes (Signed)
I have read the note, and I agree with the clinical assessment and plan.  Bernard Gonzalez,Bernard Gonzalez   

## 2015-06-04 ENCOUNTER — Other Ambulatory Visit: Payer: Self-pay

## 2015-06-04 MED ORDER — VENLAFAXINE HCL ER 75 MG PO CP24: ORAL_CAPSULE | ORAL | Status: DC

## 2015-06-04 NOTE — Telephone Encounter (Signed)
Rx retailed to mail order pharmacy per faxed request.

## 2015-06-06 ENCOUNTER — Other Ambulatory Visit: Payer: Self-pay

## 2015-06-06 MED ORDER — PROPRANOLOL HCL 10 MG PO TABS: ORAL_TABLET | ORAL | Status: DC

## 2015-06-06 NOTE — Telephone Encounter (Signed)
Received faxed request to send refill to mail order pharmacy. Refilled as requested.

## 2015-07-10 ENCOUNTER — Ambulatory Visit (INDEPENDENT_AMBULATORY_CARE_PROVIDER_SITE_OTHER): Payer: Medicare Other | Admitting: Adult Health

## 2015-07-10 ENCOUNTER — Encounter: Payer: Self-pay | Admitting: Adult Health

## 2015-07-10 VITALS — BP 122/74 | HR 56 | Ht 75.0 in | Wt 209.2 lb

## 2015-07-10 DIAGNOSIS — R51 Headache: Secondary | ICD-10-CM

## 2015-07-10 DIAGNOSIS — R519 Headache, unspecified: Secondary | ICD-10-CM

## 2015-07-10 MED ORDER — GABAPENTIN 100 MG PO CAPS: 100.0000 mg | ORAL_CAPSULE | Freq: Every day | ORAL | Status: DC

## 2015-07-10 NOTE — Progress Notes (Signed)
PATIENT: Bernard Gonzalez DOB: 03-23-1950  REASON FOR VISIT: follow up HISTORY FROM: patient  HISTORY OF PRESENT ILLNESS: Mr. Bernard Gonzalez is a 65 year old male with a history of daily headaches. He returns today for follow-up. At the last visit he was started on propranolol 20 mg twice a day. He reports that he is tolerating this medication well. Denies any significant fatigue or depression. He states that initially this helped with his headaches. He states that he went 3 weeks with no headache. He states gradually his headaches started to return. He reports that he has approximately 3 headaches a week. His headaches normally occur in the center of the head. He denies photophobia and phonophobia. He does occasionally have some nausea but no vomiting. He states that he will take aspirin and that helps occasionally. On average he states that his headaches are a 6-7/10 on the pain scale. His headache typically last between 4-12 hours. He denies any new neurological symptoms. He returns today for an evaluation.  HISTORY 04/04/15 (MM): Mr. Bernard Gonzalez is a 65 year old male with a history of daily headaches. He returns today for follow-up. The patient states that after starting Topamax he did not see any benefit in his headaches. He continues to have headaches daily. He states that the headache may occur throughout the day. He states the headache normally starts In the frontal region and radiates to the occipital region. The patient does have phonophobia but not photophobia. He states he will have nausea but not vomiting. He describes his headache as a throbbing/squeezing sensation. The patient's blood pressure is elevated today however he feels is related to a conversation with his mother before his visit. The patient would like to talk about different medication options to help with his headaches. He denies any new neurological symptoms. He returns today for an evaluation.  HISTORY 01/02/15: Mr. Bernard Gonzalez is a 65 year old  left-handed white male with a history of daily headaches. The headaches began in May or June 2016, the headaches have been daily since that time, some days are worse than others. The headaches may be bad in the morning or come on later in the day. The patient has not been able to tolerate Ultram or Torodol for the headache. He is taking Excedrin Migraine, this seems to help some. He drinks 2 cups of coffee in the morning, he will usually have at least one iced tea in the afternoon, and occasionally a soft drink. The patient has been placed on Effexor, but this has not been effective in treating the headache. The patient is on a maximum dose at this time. He returns for further evaluation. The headaches may become incapacitating 2 or 3 times a week. He denies any neck stiffness with the headache, he may have some nausea, but no vomiting. The patient returns to the office today for further evaluation.  REVIEW OF SYSTEMS: Out of a complete 14 system review of symptoms, the patient complains only of the following symptoms, and all other reviewed systems are negative.  See history of present illness  ALLERGIES: No Known Allergies  HOME MEDICATIONS: Outpatient Prescriptions Prior to Visit  Medication Sig Dispense Refill  . aspirin 500 MG EC tablet Take 500 mg by mouth every 6 (six) hours as needed for pain.    Marland Kitchen. propranolol (INDERAL) 10 MG tablet Take 1 tablet PO BID for two weeks then increase to 2 tablets BID. 120 tablet 3  . venlafaxine XR (EFFEXOR-XR) 75 MG 24 hr capsule Take 3 capsules  daily 270 capsule 1  . diclofenac (VOLTAREN) 75 MG EC tablet Take 1 tablet (75 mg total) by mouth 2 (two) times daily as needed. (Patient not taking: Reported on 07/10/2015) 60 tablet 1  . topiramate (TOPAMAX) 25 MG tablet Take one tablet at night for one week, then take 2 tablets at night for one week, then take 3 tablets at night. (Patient not taking: Reported on 07/10/2015) 90 tablet 3   No facility-administered  medications prior to visit.    PAST MEDICAL HISTORY: Past Medical History  Diagnosis Date  . Medical history non-contributory   . Wears glasses   . Headache     PAST SURGICAL HISTORY: Past Surgical History  Procedure Laterality Date  . Tibia fracture surgery  1969    rt  . Tonsillectomy    . Mass excision Left 07/28/2012    Procedure: EXCISION chronic cyst left buttock;  Surgeon: Shelly Rubenstein, MD;  Location: [facility];  Service: General;  Laterality: Left;  . 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 Thyroid disease Mother   . Pneumonia Father   . Migraines Neg Hx     SOCIAL HISTORY: Social History   Social History  . Marital Status: Married    Spouse Name: N/A  . Number of Children: 5  . Years of Education: 16   Occupational History  . retired    Social History Main Topics  . Smoking status: Former Smoker -- 1.00 packs/day    Types: Cigarettes    Quit date: 05/25/2014  . Smokeless tobacco: Never Used     Comment: stppoed 07/03/12  . Alcohol Use: 0.0 oz/week    0 Standard drinks or equivalent per week     Comment: occasionally  . Drug Use: No  . Sexual Activity: Not on file     Comment: was smoking 1 pk week   Other Topics Concern  . Not on file   Social History Narrative   Patient drinks 2 cups of caffeine daily.   Patient is left handed.      PHYSICAL EXAM  Filed Vitals:   07/10/15 0951  BP: 122/74  Pulse: 56  Height: 6\' 3"  (1.905 m)  Weight: 209 lb 3.2 oz (94.892 kg)   Body mass index is 26.15 kg/(m^2).  Generalized: Well developed, in no acute distress   Neurological examination  Mentation: Alert oriented to time, place, history taking. Follows all commands speech and  language fluent Cranial nerve II-XII: Pupils were equal round reactive to light. Extraocular movements were full, visual field were full on confrontational test. Facial sensation and strength were normal. Uvula tongue midline. Head turning and shoulder shrug  were normal and symmetric. Motor: The motor testing reveals 5 over 5 strength of all 4 extremities. Good symmetric motor tone is noted throughout.  Sensory: Sensory testing is intact to soft touch on all 4 extremities. No evidence of extinction is noted.  Coordination: Cerebellar testing reveals good finger-nose-finger and heel-to-shin bilaterally.  Gait and station: Gait is normal. Tandem gait is normal. Romberg is negative. No drift is seen.  Reflexes: Deep tendon reflexes are symmetric and normal bilaterally.   DIAGNOSTIC DATA (LABS, IMAGING, TESTING) - I reviewed patient records, labs, notes, testing and imaging myself where available.  ASSESSMENT AND PLAN 65 y.o. year old male  has a past medical history of Medical history non-contributory; Wears glasses; and Headache. here with:  1. Headaches  The patient continues to have frequent headaches. He will continue on propranolol and Effexor. I will start a low-dose of gabapentin 100 mg at bedtime. This may have to be increased in the future to achieve benefit. If gabapentin is not beneficial we may consider Botox injections in the future. Patient is amenable to this plan. He will follow-up in 3 months or sooner if needed.     Butch Penny, MSN, NP-C 07/10/2015, 10:04 AM [facility] [address] [place], [place] [address] [phone]

## 2015-07-10 NOTE — Patient Instructions (Signed)
Continue Effexor and Propranolol  Try gabapentin 100 mg at bedtime If your symptoms worsen or you develop new symptoms please let us know.

## 2015-07-10 NOTE — Progress Notes (Signed)
I have read the note, and I agree with the clinical assessment and plan.  Bernard Gonzalez KEITH   

## 2015-07-25 ENCOUNTER — Other Ambulatory Visit: Payer: Self-pay | Admitting: Neurology

## 2015-10-23 ENCOUNTER — Encounter: Payer: Self-pay | Admitting: Adult Health

## 2015-10-23 ENCOUNTER — Ambulatory Visit (INDEPENDENT_AMBULATORY_CARE_PROVIDER_SITE_OTHER): Payer: Medicare Other | Admitting: Adult Health

## 2015-10-23 VITALS — BP 141/84 | HR 56 | Ht 75.0 in | Wt 217.0 lb

## 2015-10-23 DIAGNOSIS — G43119 Migraine with aura, intractable, without status migrainosus: Secondary | ICD-10-CM | POA: Diagnosis not present

## 2015-10-23 MED ORDER — GABAPENTIN 100 MG PO CAPS: 300.0000 mg | ORAL_CAPSULE | Freq: Every day | ORAL | 3 refills | Status: DC

## 2015-10-23 MED ORDER — SUMATRIPTAN SUCCINATE 50 MG PO TABS: ORAL_TABLET | ORAL | 5 refills | Status: DC

## 2015-10-23 NOTE — Progress Notes (Signed)
PATIENT: Bernard Gonzalez DOB: 1951-01-10  REASON FOR VISIT: follow up- migraine headaches HISTORY FROM: patient  HISTORY OF PRESENT ILLNESS: Bernard Gonzalez is a 65 year old male with a history of migraine headaches. He returns today for follow-up. He states that he has at least one headache a week. He reports that his headaches can last up to 3 days. He reports that the headache location varies. He does report photophobia, phonophobia and nausea. He denies any visual changes. Denies any numbness or tingling in the upper or lower extremities. The patient is on propranolol, Effexor and gabapentin. The patient has not tried any acute therapy such as Imitrex. The patient has had an MRI in the past that was unremarkable. Also had sedimentation rate and C-reactive protein labs that were unremarkable. He returns today for an evaluation.  HISTORY 04/04/15: Bernard Gonzalez is a 65 year old male with a history of daily headaches.  He returns today for follow-up. The patient states that after starting Topamax he did not see any benefit in his headaches. He continues to have headaches daily. He states that the headache may occur throughout the day. He states the headache normally starts  In the frontal region and radiates to the occipital region. The patient does have phonophobia but not photophobia. He states he will have nausea but not vomiting. He describes his headache as a throbbing/squeezing sensation. The patient's blood pressure is elevated today however he feels is related to a conversation with his mother before his visit. The patient would like to talk about different medication options to help with his headaches. He denies any new neurological symptoms. He returns today for an evaluation.  HISTORY 01/02/15: Bernard Gonzalez is a 65 year old left-handed white male with a history of daily headaches. The headaches began in May or June 2016, the headaches have been daily since that time, some days are worse than others. The  headaches may be bad in the morning or come on later in the day. The patient has not been able to tolerate Ultram or Torodol for the headache. He is taking Excedrin Migraine, this seems to help some. He drinks 2 cups of coffee in the morning, he will usually have at least one iced tea in the afternoon, and occasionally a soft drink. The patient has been placed on Effexor, but this has not been effective in treating the headache. The patient is on a maximum dose at this time. He returns for further evaluation. The headaches may become incapacitating 2 or 3 times a week. He denies any neck stiffness with the headache, he may have some nausea, but no vomiting. The patient returns to the office today for further evaluation.  REVIEW OF SYSTEMS: Out of a complete 14 system review of symptoms, the patient complains only of the following symptoms, and all other reviewed systems are negative.  Headache, light sensitivity  ALLERGIES: No Known Allergies  HOME MEDICATIONS: Outpatient Medications Prior to Visit  Medication Sig Dispense Refill  . aspirin 500 MG EC tablet Take 500 mg by mouth every 6 (six) hours as needed for pain.    Marland Kitchen. gabapentin (NEURONTIN) 100 MG capsule Take 1 capsule (100 mg total) by mouth at bedtime. 90 capsule 3  . propranolol (INDERAL) 10 MG tablet Take 1 tablet by mouth  twice a day for 2 weeks  then increase to 2 tablets  by mouth twice a day 360 tablet 0  . venlafaxine XR (EFFEXOR-XR) 75 MG 24 hr capsule Take 3 capsules daily 270 capsule  1   No facility-administered medications prior to visit.     PAST MEDICAL HISTORY: Past Medical History:  Diagnosis Date  . Headache   . Medical history non-contributory   . Wears glasses     PAST SURGICAL HISTORY: Past Surgical History:  Procedure Laterality Date  . MASS EXCISION Left 07/28/2012   Procedure: EXCISION chronic cyst left buttock;  Surgeon: Shelly Rubenstein, MD;  Location: [facility];  Service: General;   Laterality: Left;  . TIBIA FRACTURE SURGERY  1969   rt  . TONSILLECTOMY    . TOTAL SHOULDER ARTHROPLASTY Left 11/10/2012   Procedure: TOTAL SHOULDER ARTHROPLASTY;  Surgeon: Mable Paris, MD;  Location: [facility] OR;  Service: Orthopedics;  Laterality: Left;  Left total shoulder replacement    FAMILY HISTORY: Family History  Problem Relation Age of Onset  . Thyroid disease Mother   . Pneumonia Father   . Migraines Neg Hx     SOCIAL HISTORY: Social History   Social History  . Marital status: Married    Spouse name: N/A  . Number of children: 5  . Years of education: 46   Occupational History  . retired    Social History Main Topics  . Smoking status: Former Smoker    Packs/day: 1.00    Types: Cigarettes    Quit date: 05/25/2014  . Smokeless tobacco: Never Used     Comment: stppoed 07/03/12  . Alcohol use 0.0 oz/week     Comment: occasionally  . Drug use: No  . Sexual activity: Not on file     Comment: was smoking 1 pk week   Other Topics Concern  . Not on file   Social History Narrative   Patient drinks 2 cups of caffeine daily.   Patient is left handed.      PHYSICAL EXAM  Vitals:   10/23/15 0939  BP: (!) 141/84  Pulse: (!) 56  Weight: 217 lb (98.4 kg)  Height: 6\' 3"  (1.905 m)   Body mass index is 27.12 kg/m.  Generalized: Well developed, in no acute distress   Neurological examination  Mentation: Alert oriented to time, place, history taking. Follows all commands speech and language fluent Cranial nerve II-XII: Pupils were equal round reactive to light. Extraocular movements were full, visual field were full on confrontational test. Facial sensation and strength were normal. Uvula tongue midline. Head turning and shoulder shrug  were normal and symmetric. Motor: The motor testing reveals 5 over 5 strength of all 4 extremities. Good symmetric motor tone is noted throughout.  Sensory: Sensory testing is intact to soft touch on all 4 extremities. No  evidence of extinction is noted.  Coordination: Cerebellar testing reveals good finger-nose-finger and heel-to-shin bilaterally.  Gait and station: Gait is normal. Tandem gait is normal. Romberg is negative. No drift is seen.  Reflexes: Deep tendon reflexes are symmetric and normal bilaterally.   DIAGNOSTIC DATA (LABS, IMAGING, TESTING) - I reviewed patient records, labs, notes, testing and imaging myself where available.    ASSESSMENT AND PLAN 65 y.o. year old male  has a past medical history of Headache; Medical history non-contributory; and Wears glasses. here with:  1. Migraine headaches  The patient will continue on propranolol and Effexor. We will increase gabapentin to 300 mg at bedtime. Also discussed starting Imitrex with the patient. He does not have any contraindications -denies coronary artery disease, hypertension, diabeties, no history of stroke and no family history of these. I have reviewed the side effects  with the patient. He voiced understanding. If this is not effective for his migraines he will let us know. Follow-up in 3 months or sooner if needed.     Butch Penny, MSN, NP-C 10/23/2015, 10:13 AM [facility] [address] [place], [place] [address] [phone]

## 2015-10-23 NOTE — Patient Instructions (Signed)
Increase Gabapentin 300 mg at bedtime Try Imitrex 1 tablet at onset of migraine. Repeat in 2 hours if needed.  If your symptoms worsen or you develop new symptoms please let us know.   Sumatriptan tablets What is this medicine? SUMATRIPTAN (soo ma TRIP tan) is used to treat migraines with or without aura. An aura is a strange feeling or visual disturbance that warns you of an attack. It is not used to prevent migraines. This medicine may be used for other purposes; ask your health care provider or pharmacist if you have questions. What should I tell my health care provider before I take this medicine? They need to know if you have any of these conditions: -circulation problems in fingers and toes -diabetes -heart disease -high blood pressure -high cholesterol -history of irregular heartbeat -history of stroke -kidney disease -liver disease -postmenopausal or surgical removal of uterus and ovaries -seizures -smoke tobacco -stomach or intestine problems -an unusual or allergic reaction to sumatriptan, other medicines, foods, dyes, or preservatives -pregnant or trying to get pregnant -breast-feeding How should I use this medicine? Take this medicine by mouth with a glass of water. Follow the directions on the prescription label. This medicine is taken at the first symptoms of a migraine. It is not for everyday use. If your migraine headache returns after one dose, you can take another dose as directed. You must leave at least 2 hours between doses, and do not take more than 100 mg as a single dose. Do not take more than 200 mg total in any 24 hour period. If there is no improvement at all after the first dose, do not take a second dose without talking to your doctor or health care professional. Do not take your medicine more often than directed. Talk to your pediatrician regarding the use of this medicine in children. Special care may be needed. Overdosage: If you think you have taken too  much of this medicine contact a poison control center or emergency room at once. NOTE: This medicine is only for you. Do not share this medicine with others. What if I miss a dose? This does not apply; this medicine is not for regular use. What may interact with this medicine? Do not take this medicine with any of the following medicines: -cocaine -ergot alkaloids like dihydroergotamine, ergonovine, ergotamine, methylergonovine -feverfew -MAOIs like Carbex, Eldepryl, Marplan, Nardil, and Parnate -other medicines for migraine headache like almotriptan, eletriptan, frovatriptan, naratriptan, rizatriptan, zolmitriptan -tryptophan This medicine may also interact with the following medications: -certain medicines for depression, anxiety, or psychotic disturbances This list may not describe all possible interactions. Give your health care provider a list of all the medicines, herbs, non-prescription drugs, or dietary supplements you use. Also tell them if you smoke, drink alcohol, or use illegal drugs. Some items may interact with your medicine. What should I watch for while using this medicine? Only take this medicine for a migraine headache. Take it if you get warning symptoms or at the start of a migraine attack. It is not for regular use to prevent migraine attacks. You may get drowsy or dizzy. Do not drive, use machinery, or do anything that needs mental alertness until you know how this medicine affects you. To reduce dizzy or fainting spells, do not sit or stand up quickly, especially if you are an older patient. Alcohol can increase drowsiness, dizziness and flushing. Avoid alcoholic drinks. Smoking cigarettes may increase the risk of heart-related side effects from using this medicine. If you  take migraine medicines for 10 or more days a month, your migraines may get worse. Keep a diary of headache days and medicine use. Contact your healthcare professional if your migraine attacks occur more  frequently. What side effects may I notice from receiving this medicine? Side effects that you should report to your doctor or health care professional as soon as possible: -allergic reactions like skin rash, itching or hives, swelling of the face, lips, or tongue -bloody or watery diarrhea -hallucination, loss of contact with reality -pain, tingling, numbness in the face, hands, or feet -seizures -signs and symptoms of a blood clot such as breathing problems; changes in vision; chest pain; severe, sudden headache; pain, swelling, warmth in the leg; trouble speaking; sudden numbness or weakness of the face, arm, or leg -signs and symptoms of a dangerous change in heartbeat or heart rhythm like chest pain; dizziness; fast or irregular heartbeat; palpitations, feeling faint or lightheaded; falls; breathing problems -signs and symptoms of a stroke like changes in vision; confusion; trouble speaking or understanding; severe headaches; sudden numbness or weakness of the face, arm, or leg; trouble walking; dizziness; loss of balance or coordination -stomach pain Side effects that usually do not require medical attention (report these to your doctor or health care professional if they continue or are bothersome): -changes in taste -facial flushing -headache -muscle cramps -muscle pain -nausea, vomiting -weak or tired This list may not describe all possible side effects. Call your doctor for medical advice about side effects. You may report side effects to FDA at 1-800-FDA-1088. Where should I keep my medicine? Keep out of the reach of children. Store at room temperature between 2 and 30 degrees C (36 and 86 degrees F). Throw away any unused medicine after the expiration date. NOTE: This sheet is a summary. It may not cover all possible information. If you have questions about this medicine, talk to your doctor, pharmacist, or health care provider.    2016, Elsevier/Gold Standard. (2014-08-16  17:46:40)

## 2015-10-23 NOTE — Progress Notes (Signed)
I have read the note, and I agree with the clinical assessment and plan.  Karmela Bram KEITH   

## 2015-10-27 ENCOUNTER — Other Ambulatory Visit: Payer: Self-pay | Admitting: Neurology

## 2015-12-11 ENCOUNTER — Other Ambulatory Visit: Payer: Self-pay | Admitting: Adult Health

## 2015-12-11 NOTE — Telephone Encounter (Signed)
Has RV 01-27-17.

## 2015-12-25 ENCOUNTER — Other Ambulatory Visit: Payer: Self-pay | Admitting: Neurology

## 2016-01-28 ENCOUNTER — Ambulatory Visit (INDEPENDENT_AMBULATORY_CARE_PROVIDER_SITE_OTHER): Payer: Medicare Other | Admitting: Adult Health

## 2016-01-28 ENCOUNTER — Encounter: Payer: Self-pay | Admitting: Adult Health

## 2016-01-28 VITALS — BP 122/70 | HR 75 | Ht 75.0 in | Wt 211.0 lb

## 2016-01-28 DIAGNOSIS — G43009 Migraine without aura, not intractable, without status migrainosus: Secondary | ICD-10-CM

## 2016-01-28 NOTE — Progress Notes (Signed)
I have read the note, and I agree with the clinical assessment and plan.  Rosette Bellavance KEITH   

## 2016-01-28 NOTE — Patient Instructions (Signed)
Overall you are doing well.  If your symptoms worsen or you develop new symptoms please let us know.   

## 2016-01-28 NOTE — Progress Notes (Signed)
PATIENT: Bernard Gonzalez DOB: Jun 10, 1950  REASON FOR VISIT: follow up- migraine headaches HISTORY FROM: patient  HISTORY OF PRESENT ILLNESS: Today 01/28/2016: Bernard Gonzalez is a 65 year old male with a history of migraine headaches. He returns today for follow-up. He states that since September he has not had any headaches. 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 Reports that his mother-in-law went to live in [place] and his mother passed away September 27. He states that since this happened his headaches have resolved. He feels that they were related to the stress he was experiencing as a caregiver. He has slowly weaned himself off of gabapentin, Effexor and propranolol. Denies any additional headaches. He returns today for an evaluation.  HISTORY 10/23/15: Bernard Gonzalez is a 65 year old male with a history of migraine headaches. He returns today for follow-up. He states that he has at least one headache a week. He reports that his headaches can last up to 3 days. He reports that the headache location varies. He does report photophobia, phonophobia and nausea. He denies any visual changes. Denies any numbness or tingling in the upper or lower extremities. The patient is on propranolol, Effexor and gabapentin. The patient has not tried any acute therapy such as Imitrex. The patient has had an MRI in the past that was unremarkable. Also had sedimentation rate and C-reactive protein labs that were unremarkable. He returns today for an evaluation.  HISTORY 04/04/15: Bernard Gonzalez is a 65 year old male with a history of daily headaches. He returns today for follow-up. The patient states that after starting Topamax he did not see any benefit in his headaches. He continues to have headaches daily. He states that the headache may occur throughout the day. He states the headache normally starts In the frontal region and radiates to the occipital region. The patient does have  phonophobia but not photophobia. He states he will have nausea but not vomiting. He describes his headache as a throbbing/squeezing sensation. The patient's blood pressure is elevated today however he feels is related to a conversation with his mother before his visit. The patient would like to talk about different medication options to help with his headaches. He denies any new neurological symptoms. He returns today for an evaluation.  HISTORY 01/02/15: Bernard Gonzalez is a 65 year old left-handed white male with a history of daily headaches. The headaches began in May or June 2016, the headaches have been daily since that time, some days are worse than others. The headaches may be bad in the morning or come on later in the day. The patient has not been able to tolerate Ultram or Torodol for the headache. He is taking Excedrin Migraine, this seems to help some. He drinks 2 cups of coffee in the morning, he will usually have at least one iced tea in the afternoon, and occasionally a soft drink. The patient has been placed on Effexor, but this has not been effective in treating the headache. The patient is on a maximum dose at this time. He returns for further evaluation. The headaches may become incapacitating 2 or 3 times a week. He denies any neck stiffness with the headache, he may have some nausea, but no vomiting. The patient returns to the office today for further evaluation.  REVIEW OF SYSTEMS: Out of a complete 14 system review of symptoms, the patient complains only of the following symptoms, and all other reviewed systems are negative.  See history of present illness  ALLERGIES: No Known Allergies  HOME MEDICATIONS: Outpatient Medications Prior to Visit  Medication Sig Dispense Refill  . propranolol (INDERAL) 10 MG tablet Take 2 tablets by mouth  twice a day 360 tablet 3  . venlafaxine XR (EFFEXOR-XR) 75 MG 24 hr capsule TAKE 3 CAPSULES BY MOUTH  DAILY 270 capsule 3  . aspirin 500 MG EC tablet  Take 500 mg by mouth every 6 (six) hours as needed for pain.    Marland Kitchen gabapentin (NEURONTIN) 100 MG capsule TAKE 3 CAPSULES BY MOUTH AT BEDTIME 270 capsule 0  . SUMAtriptan (IMITREX) 50 MG tablet Take 1 tablet at the onset of the headache. May repeat in 2 hours if headache persists or recurs. 10 tablet 5   No facility-administered medications prior to visit.     PAST MEDICAL HISTORY: Past Medical History:  Diagnosis Date  . Headache   . Medical history non-contributory   . Wears glasses     PAST SURGICAL HISTORY: Past Surgical History:  Procedure Laterality Date  . MASS EXCISION Left 07/28/2012   Procedure: EXCISION chronic cyst left buttock;  Surgeon: Shelly Rubenstein, MD;  Location: [facility];  Service: General;  Laterality: Left;  . TIBIA FRACTURE SURGERY  1969   rt  . TONSILLECTOMY    . TOTAL SHOULDER ARTHROPLASTY Left 11/10/2012   Procedure: TOTAL SHOULDER ARTHROPLASTY;  Surgeon: Mable Paris, MD;  Location: [facility] OR;  Service: Orthopedics;  Laterality: Left;  Left total shoulder replacement    FAMILY HISTORY: Family History  Problem Relation Age of Onset  . Thyroid disease Mother   . Pneumonia Father   . Migraines Neg Hx     SOCIAL HISTORY: Social History   Social History  . Marital status: Married    Spouse name: N/A  . Number of children: 5  . Years of education: 63   Occupational History  . retired    Social History Main Topics  . Smoking status: Former Smoker    Packs/day: 1.00    Types: Cigarettes    Quit date: 05/25/2014  . Smokeless tobacco: Never Used     Comment: stppoed 07/03/12  . Alcohol use 0.0 oz/week     Comment: occasionally  . Drug use: No  . Sexual activity: Not on file     Comment: was smoking 1 pk week   Other Topics Concern  . Not on file   Social History Narrative   Patient drinks 2 cups of caffeine daily.   Patient is left handed.      PHYSICAL EXAM  Vitals:   01/28/16 0937  BP: 122/70  Pulse: 75    Weight: 211 lb (95.7 kg)  Height: 6\' 3"  (1.905 m)   Body mass index is 26.37 kg/m.  Generalized: Well developed, in no acute distress   Neurological examination  Mentation: Alert oriented to time, place, history taking. Follows all commands speech and language fluent Cranial nerve II-XII: Pupils were equal round reactive to light. Extraocular movements were full, visual field were full on confrontational test. Facial sensation and strength were normal. Uvula tongue midline. Head turning and shoulder shrug  were normal and symmetric. Motor: The motor testing reveals 5 over 5 strength of all 4 extremities. Good symmetric motor tone is noted throughout.  Sensory: Sensory testing is intact to soft touch on all 4 extremities. No evidence of extinction is noted.  Coordination: Cerebellar testing reveals good finger-nose-finger and heel-to-shin bilaterally.  Gait and station: Gait is normal. Tandem gait is  normal. Romberg is negative. No drift is seen.  Reflexes: Deep tendon reflexes are symmetric and normal bilaterally.   DIAGNOSTIC DATA (LABS, IMAGING, TESTING) - I reviewed patient records, labs, notes, testing and imaging myself where available.     ASSESSMENT AND PLAN 65 y.o. year old male  has a past medical history of Headache; Medical history non-contributory; and Wears glasses. here with:  1. Headache  Overall the patient is doing well. His headaches have resolved. He is no longer taking any medication. Advised that if his symptoms worsen or he develops new symptoms he should let us know. He will follow-up on an as-needed basis.     Butch PennyMegan Kailea Dannemiller, MSN, NP-C 01/28/2016, 9:47 AM [facility] [address], [address] [place], [place] [address] [phone]

## 2017-07-23 ENCOUNTER — Other Ambulatory Visit: Payer: Self-pay | Admitting: Neurology

## 2019-04-02 ENCOUNTER — Ambulatory Visit: Payer: Medicare Other | Attending: Internal Medicine

## 2019-04-02 DIAGNOSIS — Z23 Encounter for immunization: Secondary | ICD-10-CM

## 2019-04-02 NOTE — Progress Notes (Signed)
   Covid-19 Vaccination Clinic  Name:  Bernard Gonzalez    MRN: [id number] DOB: 06-26-1950  04/02/2019  [name] was observed post Covid-19 immunization for 15 minutes without incidence. He was provided with Vaccine Information Sheet and instruction to access the V-Safe system.   [name] was instructed to call 911 with any severe reactions post vaccine: Marland Kitchen Difficulty breathing  . Swelling of your face and throat  . A fast heartbeat  . A bad rash all over your body  . Dizziness and weakness    Immunizations Administered    Name Date Dose VIS Date Route   Pfizer COVID-19 Vaccine 04/02/2019 12:41 PM 0.3 mL 02/03/2019 Intramuscular   Manufacturer: [employer], [employer]   Lot: [id number]   NDC: [address]-5

## 2019-04-19 ENCOUNTER — Ambulatory Visit: Payer: Self-pay

## 2019-04-26 ENCOUNTER — Ambulatory Visit: Payer: Medicare Other | Attending: Internal Medicine

## 2019-04-26 ENCOUNTER — Ambulatory Visit: Payer: Medicare Other

## 2019-04-26 DIAGNOSIS — Z23 Encounter for immunization: Secondary | ICD-10-CM

## 2019-04-26 NOTE — Progress Notes (Signed)
   Covid-19 Vaccination Clinic  Name:  GERRAD WELKER    MRN: [id number] DOB: 1951-02-13  04/26/2019  Mr. Jahn was observed post Covid-19 immunization for 15 minutes without incident. He was provided with Vaccine Information Sheet and instruction to access the V-Safe system.   Mr. Lusby was instructed to call 911 with any severe reactions post vaccine: Marland Kitchen Difficulty breathing  . Swelling of face and throat  . A fast heartbeat  . A bad rash all over body  . Dizziness and weakness   Immunizations Administered    Name Date Dose VIS Date Route   Pfizer COVID-19 Vaccine 04/26/2019 10:28 AM 0.3 mL 02/03/2019 Intramuscular   Manufacturer: [employer], [employer]   Lot: [id number]   NDC: [address]-8

## 2020-03-29 DIAGNOSIS — H52203 Unspecified astigmatism, bilateral: Secondary | ICD-10-CM | POA: Diagnosis not present

## 2020-03-29 DIAGNOSIS — H5213 Myopia, bilateral: Secondary | ICD-10-CM | POA: Diagnosis not present

## 2020-03-29 DIAGNOSIS — H25813 Combined forms of age-related cataract, bilateral: Secondary | ICD-10-CM | POA: Diagnosis not present

## 2020-03-29 DIAGNOSIS — H43813 Vitreous degeneration, bilateral: Secondary | ICD-10-CM | POA: Diagnosis not present

## 2020-04-22 DIAGNOSIS — K219 Gastro-esophageal reflux disease without esophagitis: Secondary | ICD-10-CM | POA: Diagnosis not present

## 2020-04-22 DIAGNOSIS — E7800 Pure hypercholesterolemia, unspecified: Secondary | ICD-10-CM | POA: Diagnosis not present

## 2020-05-29 DIAGNOSIS — K219 Gastro-esophageal reflux disease without esophagitis: Secondary | ICD-10-CM | POA: Diagnosis not present

## 2020-05-29 DIAGNOSIS — E7800 Pure hypercholesterolemia, unspecified: Secondary | ICD-10-CM | POA: Diagnosis not present

## 2020-08-05 DIAGNOSIS — K219 Gastro-esophageal reflux disease without esophagitis: Secondary | ICD-10-CM | POA: Diagnosis not present

## 2020-08-05 DIAGNOSIS — E7800 Pure hypercholesterolemia, unspecified: Secondary | ICD-10-CM | POA: Diagnosis not present

## 2020-09-04 DIAGNOSIS — E7800 Pure hypercholesterolemia, unspecified: Secondary | ICD-10-CM | POA: Diagnosis not present

## 2020-09-04 DIAGNOSIS — K219 Gastro-esophageal reflux disease without esophagitis: Secondary | ICD-10-CM | POA: Diagnosis not present

## 2020-10-02 DIAGNOSIS — U071 COVID-19: Secondary | ICD-10-CM | POA: Diagnosis not present

## 2020-10-02 DIAGNOSIS — R059 Cough, unspecified: Secondary | ICD-10-CM | POA: Diagnosis not present

## 2020-10-29 DIAGNOSIS — E559 Vitamin D deficiency, unspecified: Secondary | ICD-10-CM | POA: Diagnosis not present

## 2020-10-29 DIAGNOSIS — Z79899 Other long term (current) drug therapy: Secondary | ICD-10-CM | POA: Diagnosis not present

## 2020-10-29 DIAGNOSIS — E7800 Pure hypercholesterolemia, unspecified: Secondary | ICD-10-CM | POA: Diagnosis not present

## 2020-11-01 ENCOUNTER — Telehealth: Payer: Self-pay

## 2020-11-01 ENCOUNTER — Other Ambulatory Visit: Payer: Self-pay | Admitting: Family Medicine

## 2020-11-01 DIAGNOSIS — R3911 Hesitancy of micturition: Secondary | ICD-10-CM | POA: Diagnosis not present

## 2020-11-01 DIAGNOSIS — Z23 Encounter for immunization: Secondary | ICD-10-CM | POA: Diagnosis not present

## 2020-11-01 DIAGNOSIS — R946 Abnormal results of thyroid function studies: Secondary | ICD-10-CM

## 2020-11-01 DIAGNOSIS — Z0000 Encounter for general adult medical examination without abnormal findings: Secondary | ICD-10-CM | POA: Diagnosis not present

## 2020-11-01 DIAGNOSIS — K219 Gastro-esophageal reflux disease without esophagitis: Secondary | ICD-10-CM | POA: Diagnosis not present

## 2020-11-01 DIAGNOSIS — K2100 Gastro-esophageal reflux disease with esophagitis, without bleeding: Secondary | ICD-10-CM | POA: Diagnosis not present

## 2020-11-01 DIAGNOSIS — E7800 Pure hypercholesterolemia, unspecified: Secondary | ICD-10-CM | POA: Diagnosis not present

## 2020-11-01 DIAGNOSIS — R0609 Other forms of dyspnea: Secondary | ICD-10-CM | POA: Diagnosis not present

## 2020-11-01 NOTE — Telephone Encounter (Signed)
Referral notes received from [facility] AT [facility], Phone #: [phone], Fax #: [phone]   A copy of the notes have been placed in the scheduling box for check-out to pick-up and to enter referral. Original notes placed in file cabinet.

## 2020-11-05 ENCOUNTER — Ambulatory Visit
Admission: RE | Admit: 2020-11-05 | Discharge: 2020-11-05 | Disposition: A | Discharge status: Home / Self Care | Payer: Medicare Other | Source: Ambulatory Visit | Attending: Family Medicine | Admitting: Family Medicine

## 2020-11-05 DIAGNOSIS — E041 Nontoxic single thyroid nodule: Secondary | ICD-10-CM | POA: Diagnosis not present

## 2020-11-05 DIAGNOSIS — R946 Abnormal results of thyroid function studies: Secondary | ICD-10-CM

## 2020-11-06 ENCOUNTER — Ambulatory Visit
Admission: RE | Admit: 2020-11-06 | Discharge: 2020-11-06 | Disposition: A | Discharge status: Home / Self Care | Payer: Medicare Other | Source: Ambulatory Visit | Attending: Family Medicine | Admitting: Family Medicine

## 2020-11-06 ENCOUNTER — Other Ambulatory Visit: Payer: Self-pay | Admitting: Family Medicine

## 2020-11-06 ENCOUNTER — Other Ambulatory Visit: Payer: Self-pay

## 2020-11-06 DIAGNOSIS — R0600 Dyspnea, unspecified: Secondary | ICD-10-CM

## 2020-11-06 DIAGNOSIS — J439 Emphysema, unspecified: Secondary | ICD-10-CM | POA: Diagnosis not present

## 2020-11-06 DIAGNOSIS — R0609 Other forms of dyspnea: Secondary | ICD-10-CM

## 2020-11-12 ENCOUNTER — Other Ambulatory Visit: Payer: Self-pay | Admitting: Family Medicine

## 2020-11-12 DIAGNOSIS — E041 Nontoxic single thyroid nodule: Secondary | ICD-10-CM

## 2020-11-26 ENCOUNTER — Ambulatory Visit: Payer: Medicare Other | Admitting: Cardiology

## 2020-12-05 ENCOUNTER — Ambulatory Visit
Admission: RE | Admit: 2020-12-05 | Discharge: 2020-12-05 | Disposition: A | Discharge status: Home / Self Care | Payer: Medicare Other | Source: Ambulatory Visit | Attending: Family Medicine | Admitting: Family Medicine

## 2020-12-05 ENCOUNTER — Other Ambulatory Visit (HOSPITAL_COMMUNITY)
Admission: RE | Admit: 2020-12-05 | Discharge: 2020-12-05 | Disposition: A | Discharge status: Home / Self Care | Payer: Medicare Other | Source: Ambulatory Visit | Attending: Internal Medicine | Admitting: Internal Medicine

## 2020-12-05 DIAGNOSIS — E041 Nontoxic single thyroid nodule: Secondary | ICD-10-CM | POA: Diagnosis not present

## 2020-12-05 DIAGNOSIS — E0789 Other specified disorders of thyroid: Secondary | ICD-10-CM | POA: Diagnosis not present

## 2020-12-08 LAB — CYTOLOGY - NON PAP

## 2020-12-09 ENCOUNTER — Encounter: Payer: Self-pay | Admitting: *Deleted

## 2020-12-24 DIAGNOSIS — E785 Hyperlipidemia, unspecified: Secondary | ICD-10-CM | POA: Diagnosis not present

## 2020-12-24 DIAGNOSIS — E042 Nontoxic multinodular goiter: Secondary | ICD-10-CM | POA: Diagnosis not present

## 2021-01-07 ENCOUNTER — Ambulatory Visit: Payer: Medicare Other | Admitting: Cardiology

## 2021-01-07 ENCOUNTER — Encounter: Payer: Self-pay | Admitting: Cardiology

## 2021-01-07 ENCOUNTER — Other Ambulatory Visit: Payer: Self-pay

## 2021-01-07 VITALS — BP 156/84 | HR 66 | Ht 75.0 in | Wt 239.0 lb

## 2021-01-07 DIAGNOSIS — R6889 Other general symptoms and signs: Secondary | ICD-10-CM | POA: Diagnosis not present

## 2021-01-07 DIAGNOSIS — R0609 Other forms of dyspnea: Secondary | ICD-10-CM | POA: Diagnosis not present

## 2021-01-07 LAB — CBC
Hematocrit: 43 % (ref 37.5–51.0)
Hemoglobin: 14.4 g/dL (ref 13.0–17.7)
MCH: 28.3 pg (ref 26.6–33.0)
MCHC: 33.5 g/dL (ref 31.5–35.7)
MCV: 85 fL (ref 79–97)
Platelets: 235 10*3/uL (ref 150–450)
RBC: 5.09 x10E6/uL (ref 4.14–5.80)
RDW: 13.7 % (ref 11.6–15.4)
WBC: 7.6 10*3/uL (ref 3.4–10.8)

## 2021-01-07 NOTE — Patient Instructions (Addendum)
Medication Instructions:  Your physician recommends that you continue on your current medications as directed. Please refer to the Current Medication list given to you today.  *If you need a refill on your cardiac medications before your next appointment, please call your pharmacy*   Lab Work: TODAY: CBC If you have labs (blood work) drawn today and your tests are completely normal, you will receive your results only by: MyChart Message (if you have MyChart) OR A paper copy in the mail If you have any lab test that is abnormal or we need to change your treatment, we will call you to review the results.   Testing/Procedures: Your physician has requested that you have an echocardiogram. Echocardiography is a painless test that uses sound waves to create images of your heart. It provides your doctor with information about the size and shape of your heart and how well your heart's chambers and valves are working. This procedure takes approximately one hour. There are no restrictions for this procedure.  Your physician has requested that you have a calcium score CT scan.   Your physician has requested that you have en exercise stress myoview. For further information please visit [email or web address] Please follow instruction sheet, as given.   Follow-Up: At [facility], you and your health needs are our priority.  As part of our continuing mission to provide you with exceptional heart care, we have created designated Provider Care Teams.  These Care Teams include your primary Cardiologist (physician) and Advanced Practice Providers (APPs -  Physician Assistants and Nurse Practitioners) who all work together to provide you with the care you need, when you need it.   Follow up with Dr. Mayford Knife as needed based on results of testing}

## 2021-01-07 NOTE — Addendum Note (Signed)
Addended by: Theresia Majors on: 01/07/2021 02:07 PM   Modules accepted: Orders

## 2021-01-07 NOTE — Addendum Note (Signed)
Addended by: Quintella Reichert on: 01/07/2021 02:48 PM   Modules accepted: Orders

## 2021-01-07 NOTE — Progress Notes (Signed)
Cardiology CONSULT Note    Date:  01/07/2021   ID:  Bernard Gonzalez, DOB Jan 11, 1951, MRN [id number]  PCP:  Daisy Floro, MD  Cardiologist:  Armanda Magic, MD   Chief Complaint  Patient presents with   New Patient (Initial Visit)    Exercise intolerance and dyspnea on exertion    History of Present Illness:  Bernard Gonzalez is a 70 y.o. male who is being seen today for the evaluation of reduced exercise tolerance and DOE at the request of Daisy Floro, MD.  This is a 70 year old male with a history of GERD, migraine headaches, arthritis who recently saw his PCP and complained of reduced exercise tolerance and dyspnea on exertion. He tells me that he started having SOB since the COVID 19 pandemic.  He has not had COVID but has been very sedentary in the past few years.  He has noticed that he does not have the stamina he used to have.  He denies any chest pain or pressure.  THe SOB is mainly with exertion with no PND, orthopnea or LE edema.  He denies any dizziness, palpitations or syncope.  He used to smoke remotely.  He has no fm hx of CAD.  Past Medical History:  Diagnosis Date   Arthritis    Difficulty in urination    Family history of prostate problems    Headache    Heartburn    Indigestion    Medical history non-contributory    Migraine    Wears glasses     Past Surgical History:  Procedure Laterality Date   MASS EXCISION Left 07/28/2012   Procedure: EXCISION chronic cyst left buttock;  Surgeon: Shelly Rubenstein, MD;  Location: [facility];  Service: General;  Laterality: Left;   TIBIA FRACTURE SURGERY  1969   rt   TONSILLECTOMY     TOTAL SHOULDER ARTHROPLASTY Left 11/10/2012   Procedure: TOTAL SHOULDER ARTHROPLASTY;  Surgeon: Mable Paris, MD;  Location: [facility] OR;  Service: Orthopedics;  Laterality: Left;  Left total shoulder replacement    Current Medications: Current Meds  Medication Sig   aspirin-acetaminophen-caffeine (EXCEDRIN  MIGRAINE) 250-250-65 MG tablet Take by mouth every 6 (six) hours as needed for headache.   atorvastatin (LIPITOR) 20 MG tablet Take 20 mg by mouth daily.   buPROPion (WELLBUTRIN SR) 150 MG 12 hr tablet Take 150 mg by mouth 2 (two) times daily.   ezetimibe (ZETIA) 10 MG tablet Take 10 mg by mouth daily.   famotidine (PEPCID) 10 MG tablet Take 10 mg by mouth 2 (two) times daily.   MOLNUPIRAVIR PO Take by mouth.   Multiple Vitamins-Minerals (CENTRUM SILVER ADULT 50+) TABS Take by mouth.   pantoprazole (PROTONIX) 40 MG tablet Take 40 mg by mouth daily.   propranolol (INDERAL) 10 MG tablet Take 2 tablets by mouth  twice a day   tamsulosin (FLOMAX) 0.4 MG CAPS capsule Take 0.8 mg by mouth daily.   venlafaxine XR (EFFEXOR-XR) 75 MG 24 hr capsule TAKE 3 CAPSULES BY MOUTH  DAILY    Allergies:   Patient has no known allergies.   Social History   Socioeconomic History   Marital status: Married    Spouse name: Not on file   Number of children: 5   Years of education: 16   Highest education level: Not on file  Occupational History   Occupation: retired  Tobacco Use   Smoking status: Former    Packs/day: 1.00  Types: Cigarettes    Quit date: 05/25/2014    Years since quitting: 6.6   Smokeless tobacco: Never   Tobacco comments:    stppoed 07/03/12  Substance and Sexual Activity   Alcohol use: Yes    Alcohol/week: 0.0 standard drinks    Comment: occasionally   Drug use: No   Sexual activity: Not on file    Comment: was smoking 1 pk week  Other Topics Concern   Not on file  Social History Narrative   Patient drinks 2 cups of caffeine daily.   Patient is left handed.   Social Determinants of Health   Financial Resource Strain: Not on file  Food Insecurity: Not on file  Transportation Needs: Not on file  Physical Activity: Not on file  Stress: Not on file  Social Connections: Not on file     Family History:  The patient's family history includes Pneumonia in his father; Thyroid  disease in his mother.   ROS:   Please see the history of present illness.    ROS All other systems reviewed and are negative.  No flowsheet data found.     PHYSICAL EXAM:   VS:  BP (!) 156/84   Pulse 66   Ht 6\' 3"  (1.905 m)   Wt 239 lb (108.4 kg)   SpO2 97%   BMI 29.87 kg/m    GEN: Well nourished, well developed, in no acute distress  HEENT: normal  Neck: no JVD, carotid bruits, or masses Cardiac: RRR; no murmurs, rubs, or gallops,no edema.  Intact distal pulses bilaterally.  Respiratory:  clear to auscultation bilaterally, normal work of breathing GI: soft, nontender, nondistended, + BS MS: no deformity or atrophy  Skin: warm and dry, no rash Neuro:  Alert and Oriented x 3, Strength and sensation are intact Psych: euthymic mood, full affect  Wt Readings from Last 3 Encounters:  01/07/21 239 lb (108.4 kg)  01/28/16 211 lb (95.7 kg)  10/23/15 217 lb (98.4 kg)      Studies/Labs Reviewed:   EKG:  EKG is ordered today.  The ekg ordered today demonstrates normal sinus rhythm with anterior infarct and no ST changes  Recent Labs: No results found for requested labs within last 8760 hours.   Lipid Panel No results found for: CHOL, TRIG, HDL, CHOLHDL, VLDL, LDLCALC, LDLDIRECT   Additional studies/ records that were reviewed today include:  Office visit notes from PCP    ASSESSMENT:    1. Exercise intolerance   2. Dyspnea on exertion      PLAN:  In order of problems listed above:  Reduced exercise tolerance/DOE -EKG is nonischemic -Cardiac risk factors for CAD include advanced age, remote tobacco use -Get a stress myoview to assess for ischemia as well as assess his exercise tolerance and rule out chronotropic incompetence -Shared Decision Making/Informed Consent The risks [chest pain, shortness of breath, cardiac arrhythmias, dizziness, blood pressure fluctuations, myocardial infarction, stroke/transient ischemic attack, nausea, vomiting, allergic reaction,  radiation exposure, metallic taste sensation and life-threatening complications (estimated to be 1 in 10,000)], benefits (risk stratification, diagnosing coronary artery disease, treatment guidance) and alternatives of a nuclear stress test were discussed in detail with Mr. Kaufhold and he agrees to proceed. -will get baseline echo as well -Check hemoglobin -I will get a coronary Ca score to assess future risk  2 .  Elevated BP with no hx of hypertension -he has a hx of white coat HTN and has been normal at home  Time Spent: 20 minutes  total time of encounter, including 15 minutes spent in face-to-face patient care on the date of this encounter. This time includes coordination of care and counseling regarding above mentioned problem list. Remainder of non-face-to-face time involved reviewing chart documents/testing relevant to the patient encounter and documentation in the medical record. I have independently reviewed documentation from referring provider  Medication Adjustments/Labs and Tests Ordered: Current medicines are reviewed at length with the patient today.  Concerns regarding medicines are outlined above.  Medication changes, Labs and Tests ordered today are listed in the Patient Instructions below.  There are no Patient Instructions on file for this visit.   Signed, Armanda Magic, MD  01/07/2021 1:33 PM    [facility] [address] [place], [place], [place]  [address] Phone: [phone]; Fax: [phone]

## 2021-01-23 ENCOUNTER — Telehealth (HOSPITAL_COMMUNITY): Payer: Self-pay

## 2021-01-23 NOTE — Telephone Encounter (Signed)
Detailed instructions left on the patient's answering machine. Asked to call back with any questions. S.Lateria Alderman EMTP 

## 2021-01-27 DIAGNOSIS — R051 Acute cough: Secondary | ICD-10-CM | POA: Diagnosis not present

## 2021-01-28 ENCOUNTER — Telehealth: Payer: Self-pay | Admitting: Cardiology

## 2021-01-28 NOTE — Telephone Encounter (Signed)
Left message for patient and advised that medications should not interfere. Advised to call back with any questions.

## 2021-01-28 NOTE — Telephone Encounter (Signed)
Pt went to see his pcp yesterday and was prescribed a cough medicine promethazine and an antibiotic doxycycline. Pt would like to make sure that taking these medications will not interfere with scheduled testing tomorrow... please advise.

## 2021-01-30 ENCOUNTER — Ambulatory Visit (HOSPITAL_COMMUNITY): Payer: Medicare Other | Attending: Cardiovascular Disease

## 2021-01-30 ENCOUNTER — Ambulatory Visit
Admission: RE | Admit: 2021-01-30 | Discharge: 2021-01-30 | Disposition: A | Discharge status: Home / Self Care | Payer: Self-pay | Source: Ambulatory Visit | Attending: Cardiology | Admitting: Cardiology

## 2021-01-30 ENCOUNTER — Other Ambulatory Visit: Payer: Self-pay

## 2021-01-30 ENCOUNTER — Ambulatory Visit (HOSPITAL_BASED_OUTPATIENT_CLINIC_OR_DEPARTMENT_OTHER): Payer: Medicare Other

## 2021-01-30 DIAGNOSIS — R6889 Other general symptoms and signs: Secondary | ICD-10-CM

## 2021-01-30 DIAGNOSIS — R0609 Other forms of dyspnea: Secondary | ICD-10-CM

## 2021-01-30 DIAGNOSIS — R0602 Shortness of breath: Secondary | ICD-10-CM | POA: Diagnosis not present

## 2021-01-30 LAB — ECHOCARDIOGRAM COMPLETE
AR max vel: 1.85 cm2
AV Area VTI: 1.68 cm2
AV Area mean vel: 1.93 cm2
AV Mean grad: 8.9 mmHg
AV Peak grad: 17.6 mmHg
Ao pk vel: 2.1 m/s
Area-P 1/2: 4.8 cm2
Height: 75 in
S' Lateral: 2.9 cm
Weight: 3824 oz

## 2021-01-30 LAB — MYOCARDIAL PERFUSION IMAGING
LV dias vol: 80 mL (ref 62–150)
LV sys vol: 28 mL
Nuc Stress EF: 64 %
Peak HR: 120 {beats}/min
Rest HR: 75 {beats}/min
Rest Nuclear Isotope Dose: 10.2 mCi
SDS: 0
SRS: 0
SSS: 0
ST Depression (mm): 0 mm
Stress Nuclear Isotope Dose: 31.8 mCi
TID: 0.99

## 2021-01-30 MED ORDER — TECHNETIUM TC 99M TETROFOSMIN IV KIT
31.8000 | PACK | Freq: Once | INTRAVENOUS | Status: AC | PRN
  Administered 2021-01-30: 31.8 via INTRAVENOUS
  Filled 2021-01-30: qty 32

## 2021-01-30 MED ORDER — PERFLUTREN LIPID MICROSPHERE
1.0000 mL | INTRAVENOUS | Status: AC | PRN
Start: 2021-01-30 — End: 2021-01-30
  Administered 2021-01-30: 1 mL via INTRAVENOUS
  Administered 2021-01-30: 2 mL via INTRAVENOUS

## 2021-01-30 MED ORDER — TECHNETIUM TC 99M TETROFOSMIN IV KIT
10.2000 | PACK | Freq: Once | INTRAVENOUS | Status: AC | PRN
  Administered 2021-01-30: 10.2 via INTRAVENOUS
  Filled 2021-01-30: qty 11

## 2021-01-30 MED ORDER — REGADENOSON 0.4 MG/5ML IV SOLN
0.4000 mg | Freq: Once | INTRAVENOUS | Status: AC
  Administered 2021-01-30: 0.4 mg via INTRAVENOUS

## 2021-01-31 ENCOUNTER — Telehealth: Payer: Self-pay

## 2021-01-31 DIAGNOSIS — R6889 Other general symptoms and signs: Secondary | ICD-10-CM

## 2021-01-31 DIAGNOSIS — R0609 Other forms of dyspnea: Secondary | ICD-10-CM

## 2021-01-31 NOTE — Telephone Encounter (Signed)
The patient has been notified of the result and verbalized understanding.  All questions (if any) were answered. Theresia Majors, RN 01/31/2021 11:38 AM  Lab work has been scheduled.

## 2021-02-04 DIAGNOSIS — E7800 Pure hypercholesterolemia, unspecified: Secondary | ICD-10-CM | POA: Diagnosis not present

## 2021-02-04 DIAGNOSIS — J439 Emphysema, unspecified: Secondary | ICD-10-CM | POA: Diagnosis not present

## 2021-02-04 DIAGNOSIS — K219 Gastro-esophageal reflux disease without esophagitis: Secondary | ICD-10-CM | POA: Diagnosis not present

## 2021-02-05 ENCOUNTER — Other Ambulatory Visit: Payer: Self-pay

## 2021-02-05 ENCOUNTER — Other Ambulatory Visit: Payer: Medicare Other | Admitting: *Deleted

## 2021-02-05 DIAGNOSIS — R6889 Other general symptoms and signs: Secondary | ICD-10-CM | POA: Diagnosis not present

## 2021-02-05 DIAGNOSIS — R931 Abnormal findings on diagnostic imaging of heart and coronary circulation: Secondary | ICD-10-CM | POA: Diagnosis not present

## 2021-02-05 DIAGNOSIS — Z136 Encounter for screening for cardiovascular disorders: Secondary | ICD-10-CM | POA: Diagnosis not present

## 2021-02-05 DIAGNOSIS — R0609 Other forms of dyspnea: Secondary | ICD-10-CM

## 2021-02-05 LAB — ALT: ALT: 20 IU/L (ref 0–44)

## 2021-02-05 LAB — LIPID PANEL
Chol/HDL Ratio: 3.2 ratio (ref 0.0–5.0)
Cholesterol, Total: 142 mg/dL (ref 100–199)
HDL: 45 mg/dL (ref >39)
LDL Chol Calc (NIH): 79 mg/dL (ref 0–99)
Triglycerides: 95 mg/dL (ref 0–149)
VLDL Cholesterol Cal: 18 mg/dL (ref 5–40)

## 2021-02-19 ENCOUNTER — Encounter: Payer: Self-pay | Admitting: Emergency Medicine

## 2021-02-19 ENCOUNTER — Other Ambulatory Visit: Payer: Self-pay

## 2021-02-19 ENCOUNTER — Ambulatory Visit: Payer: Medicare Other | Admitting: Emergency Medicine

## 2021-02-19 VITALS — BP 130/80 | HR 65 | Temp 98.1°F | Ht 75.0 in | Wt 235.2 lb

## 2021-02-19 DIAGNOSIS — R911 Solitary pulmonary nodule: Secondary | ICD-10-CM

## 2021-02-19 DIAGNOSIS — R0602 Shortness of breath: Secondary | ICD-10-CM

## 2021-02-19 DIAGNOSIS — J449 Chronic obstructive pulmonary disease, unspecified: Secondary | ICD-10-CM | POA: Insufficient documentation

## 2021-02-19 DIAGNOSIS — R9389 Abnormal findings on diagnostic imaging of other specified body structures: Secondary | ICD-10-CM

## 2021-02-19 NOTE — Assessment & Plan Note (Signed)
Bronchiectatic change and some macro nodularity particular focus in the right lower lobe.  Question whether this represents old scar from a prior pneumonia, possible mycobacterial disease.  Consider also noninfectious inflammatory process.  Atypical appearance for malignancy but this will need to be followed.  We will plan to repeat his CT scan of the chest in March

## 2021-02-19 NOTE — Progress Notes (Signed)
Subjective:    Patient ID: Bernard Gonzalez, male    DOB: 1950-09-08, 70 y.o.   MRN: [id number]  HPI 70 year old former smoker (20-25 pack years) with a history of GERD, migraine headaches.  He has been experiencing progressive exertional shortness of breath, appears to have evolved due to sedentary lifestyle during the pandemic.  No chest pain.  He was seen by Dr. Mayford Knife with cardiology and had a reassuring stress Myoview, cardiac CT as below.  He reports exertional SOB that has progressed over last 2 yrs, more so over last 6 months. No CP. Has gained about 20 lbs over 2 yrs. No cough. He snores.   Cardiac CT 01/30/2021 reviewed by me shows some peribronchial vascular interstitial thickening with some groundglass change and micro macro nodularity most pronounced right lower lobe.  No overt emphysema   Review of Systems As per HPI  Past Medical History:  Diagnosis Date   Arthritis    Difficulty in urination    Family history of prostate problems    Headache    Heartburn    Indigestion    Medical history non-contributory    Migraine    Wears glasses      Family History  Problem Relation Age of Onset   Thyroid disease Mother    Pneumonia Father    Migraines Neg Hx      Social History   Socioeconomic History   Marital status: Married    Spouse name: Not on file   Number of children: 5   Years of education: 16   Highest education level: Not on file  Occupational History   Occupation: retired  Tobacco Use   Smoking status: Former    Packs/day: 1.00    Types: Cigarettes    Quit date: 05/25/2014    Years since quitting: 6.7   Smokeless tobacco: Never   Tobacco comments:    stppoed 07/03/12  Substance and Sexual Activity   Alcohol use: Yes    Alcohol/week: 0.0 standard drinks    Comment: occasionally   Drug use: No   Sexual activity: Not on file    Comment: was smoking 1 pk week  Other Topics Concern   Not on file  Social History Narrative   Patient drinks 2 cups of  caffeine daily.   Patient is left handed.   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     atorvastatin (LIPITOR) 20 MG tablet Take 20 mg by mouth daily.     buPROPion (WELLBUTRIN SR) 150 MG 12 hr tablet Take 150 mg by mouth 2 (two) times daily.     ezetimibe (ZETIA) 10 MG tablet Take 10 mg by mouth daily.     famotidine (PEPCID) 10 MG tablet Take 10 mg by mouth 2 (two) times daily.     Multiple Vitamins-Minerals (CENTRUM SILVER ADULT 50+) TABS Take  by mouth.     pantoprazole (PROTONIX) 40 MG tablet Take 40 mg by mouth daily.     propranolol (INDERAL) 10 MG tablet Take 2 tablets by mouth  twice a day 360 tablet 3   tamsulosin (FLOMAX) 0.4 MG CAPS capsule Take 0.8 mg by mouth daily.     venlafaxine XR (EFFEXOR-XR) 75 MG 24 hr capsule TAKE 3 CAPSULES BY MOUTH  DAILY 270 capsule 3   MOLNUPIRAVIR PO Take by mouth.     No facility-administered medications prior to visit.         Objective:   Physical Exam  Vitals:   02/19/21 1400  BP: 130/80  Pulse: 65  Temp: 98.1 F (36.7 C)  TempSrc: Oral  SpO2: 97%  Weight: 235 lb 3.2 oz (106.7 kg)  Height: 6\' 3"  (1.905 m)   Gen: Pleasant, well-nourished, in no distress,  normal affect  ENT: No lesions,  mouth clear,  oropharynx clear, no postnasal drip  Neck: No JVD, no stridor  Lungs: No use of accessory muscles, no crackles or  wheezing on normal respiration, no wheeze on forced expiration  Cardiovascular: RRR, heart sounds normal, no murmur or gallops, no peripheral edema  Musculoskeletal: No deformities, no cyanosis or clubbing  Neuro: alert, awake, non focal  Skin: Warm, no lesions or rash     Assessment & Plan:  SOB (shortness of breath) Slow because of shortness of breath and smoker.  Suspect that this may be evolving COPD.  His cardiac evaluation was reassuring.  He has on bronchitic changes, bronchiectatic changes on CT chest that would also be consistent.  We will perform pulmonary function testing.  If evidence for obstruction then can discuss starting bronchodilator therapy.  Abnormal CT of the chest Bronchiectatic change and some macro nodularity particular focus in the right lower lobe.  Question whether this represents old scar from a prior pneumonia, possible mycobacterial disease.  Consider also noninfectious inflammatory process.  Atypical appearance for malignancy but this will need to be followed.  We will plan to repeat his CT scan of the chest in March     April, MD, PhD 02/19/2021, 5:26 PM [facility] [phone] or if no answer before 7:00PM call [phone] For any issues after 7:00PM please call eLink [phone]

## 2021-02-19 NOTE — Patient Instructions (Signed)
We will arrange for pulmonary function testing at your next office visit We will repeat your CT scan of the chest without contrast in early March 2023 to compare with your prior. Follow Dr. Delton Coombes next available with PFT on the same day.

## 2021-02-19 NOTE — Assessment & Plan Note (Signed)
Slow because of shortness of breath and smoker.  Suspect that this may be evolving COPD.  His cardiac evaluation was reassuring.  He has on bronchitic changes, bronchiectatic changes on CT chest that would also be consistent.  We will perform pulmonary function testing.  If evidence for obstruction then can discuss starting bronchodilator therapy.

## 2021-04-01 ENCOUNTER — Ambulatory Visit: Payer: Medicare Other | Admitting: Emergency Medicine

## 2021-04-01 ENCOUNTER — Other Ambulatory Visit: Payer: Self-pay

## 2021-04-01 ENCOUNTER — Encounter: Payer: Self-pay | Admitting: Emergency Medicine

## 2021-04-01 ENCOUNTER — Ambulatory Visit (INDEPENDENT_AMBULATORY_CARE_PROVIDER_SITE_OTHER): Payer: Medicare Other | Admitting: Emergency Medicine

## 2021-04-01 DIAGNOSIS — R9389 Abnormal findings on diagnostic imaging of other specified body structures: Secondary | ICD-10-CM | POA: Diagnosis not present

## 2021-04-01 DIAGNOSIS — R911 Solitary pulmonary nodule: Secondary | ICD-10-CM | POA: Diagnosis not present

## 2021-04-01 DIAGNOSIS — R0602 Shortness of breath: Secondary | ICD-10-CM

## 2021-04-01 LAB — PULMONARY FUNCTION TEST
DL/VA % pred: 88 %
DL/VA: 3.48 ml/min/mmHg/L
DLCO cor % pred: 90 %
DLCO cor: 26.25 ml/min/mmHg
DLCO unc % pred: 90 %
DLCO unc: 26.25 ml/min/mmHg
FEF 25-75 Post: 1.97 L/sec
FEF 25-75 Pre: 1.64 L/sec
FEF2575-%Change-Post: 19 %
FEF2575-%Pred-Post: 71 %
FEF2575-%Pred-Pre: 59 %
FEV1-%Change-Post: 4 %
FEV1-%Pred-Post: 76 %
FEV1-%Pred-Pre: 73 %
FEV1-Post: 2.85 L
FEV1-Pre: 2.73 L
FEV1FVC-%Change-Post: 1 %
FEV1FVC-%Pred-Pre: 92 %
FEV6-%Change-Post: 2 %
FEV6-%Pred-Post: 87 %
FEV6-%Pred-Pre: 84 %
FEV6-Post: 4.15 L
FEV6-Pre: 4.04 L
FEV6FVC-%Change-Post: 0 %
FEV6FVC-%Pred-Post: 105 %
FEV6FVC-%Pred-Pre: 105 %
FVC-%Change-Post: 2 %
FVC-%Pred-Post: 82 %
FVC-%Pred-Pre: 80 %
FVC-Post: 4.15 L
FVC-Pre: 4.05 L
Post FEV1/FVC ratio: 69 %
Post FEV6/FVC ratio: 100 %
Pre FEV1/FVC ratio: 67 %
Pre FEV6/FVC Ratio: 100 %
RV % pred: 125 %
RV: 3.4 L
TLC % pred: 108 %
TLC: 8.52 L

## 2021-04-01 NOTE — Assessment & Plan Note (Signed)
He did have moderate obstruction on his pulmonary function testing.  Discussed this with him today.  He does not have any other symptoms of COPD.  He wants to hold off for now on bronchodilator therapy, thinks he can impact his exertional dyspnea by working on his exercise and conditioning, weight loss.  Agree with this plan.  We can revisit BD therapy or consider repeating his PFT going forward depending on how his breathing progresses.

## 2021-04-01 NOTE — Assessment & Plan Note (Signed)
Some macronodular inflammatory change especially at the right base.  Plan to repeat his CT in March to look for interval change.  Based on this we will decide next steps in the evaluation.

## 2021-04-01 NOTE — Patient Instructions (Signed)
We reviewed your pulmonary function testing today. We will hold off on starting any inhaled medication at this time. Agree with working on slowly and steadily increasing your exercise, conditioning. We will plan to repeat your CT scan of the chest in March. Follow Dr. Lamonte Sakai in March after your CT so we can review the results together.

## 2021-04-01 NOTE — Patient Instructions (Signed)
Full PFT performed today. °

## 2021-04-01 NOTE — Progress Notes (Signed)
Full PFT performed today. °

## 2021-04-01 NOTE — Progress Notes (Signed)
Subjective:    Patient ID: Bernard Gonzalez, male    DOB: 05/25/1950, 71 y.o.   MRN: [id number]  HPI 72 year old former smoker (20-25 pack years) with a history of GERD, migraine headaches.  He has been experiencing progressive exertional shortness of breath, appears to have evolved due to sedentary lifestyle during the pandemic.  No chest pain.  He was seen by Dr. Mayford Knife with cardiology and had a reassuring stress Myoview, cardiac CT as below.  He reports exertional SOB that has progressed over last 2 yrs, more so over last 6 months. No CP. Has gained about 20 lbs over 2 yrs. No cough. He snores.   Cardiac CT 01/30/2021 reviewed by me shows some peribronchial vascular interstitial thickening with some groundglass change and micro macro nodularity most pronounced right lower lobe.  No overt emphysema  ROV 04/01/21 --71 year old former smoker evaluated in late December 2022 for progressive exertional shortness of breath.  He had a reassuring cardiac evaluation.  He also had some bronchiectatic changes and micro macro nodularity on CT scan of the chest, question possible atypical infection.  He underwent pulmonary function testing today and is planning for repeat CT chest in March. He tells me that his breathing is stable. He is still limited with heavier exertion. No real wheeze or cough.   Pulmonary function testing from today reviewed by me shows moderate obstruction without a bronchodilator response, normal lung volumes, normal diffusion capacity.  There is curve of his flow-volume loop.   Review of Systems As per HPI  Past Medical History:  Diagnosis Date   Arthritis    Difficulty in urination    Family history of prostate problems    Headache    Heartburn    Indigestion    Medical history non-contributory    Migraine    Wears glasses      Family History  Problem Relation Age of Onset   Thyroid disease Mother    Pneumonia Father    Migraines Neg Hx      Social History    Socioeconomic History   Marital status: Married    Spouse name: Not on file   Number of children: 5   Years of education: 16   Highest education level: Not on file  Occupational History   Occupation: retired  Tobacco Use   Smoking status: Former    Packs/day: 1.00    Types: Cigarettes    Quit date: 05/25/2014    Years since quitting: 6.8   Smokeless tobacco: Never   Tobacco comments:    stppoed 07/03/12  Substance and Sexual Activity   Alcohol use: Yes    Alcohol/week: 0.0 standard drinks    Comment: occasionally   Drug use: No   Sexual activity: Not on file    Comment: was smoking 1 pk week  Other Topics Concern   Not on file  Social History Narrative   Patient drinks 2 cups of caffeine daily.   Patient is left handed.   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     atorvastatin (LIPITOR) 20 MG tablet Take 20 mg by mouth daily.     buPROPion (WELLBUTRIN SR) 150 MG 12 hr tablet Take 150 mg by mouth 2 (two) times daily.     ezetimibe (ZETIA) 10 MG tablet Take 10 mg by mouth daily.     famotidine (PEPCID) 10 MG tablet Take 10 mg by mouth 2 (two) times daily.     Multiple Vitamins-Minerals (CENTRUM SILVER ADULT 50+) TABS Take by mouth.     pantoprazole (PROTONIX) 40 MG tablet Take 40 mg by mouth daily.     propranolol (INDERAL) 10 MG tablet Take 2 tablets by mouth   twice a day 360 tablet 3   tamsulosin (FLOMAX) 0.4 MG CAPS capsule Take 0.8 mg by mouth daily.     venlafaxine XR (EFFEXOR-XR) 75 MG 24 hr capsule TAKE 3 CAPSULES BY MOUTH  DAILY (Patient not taking: Reported on 04/01/2021) 270 capsule 3   No facility-administered medications prior to visit.         Objective:   Physical Exam  Vitals:   04/01/21 1604  BP: 130/70  Pulse: (!) 59  Temp: 97.6 F (36.4 C)  TempSrc: Oral  SpO2: 97%  Weight: 141 lb (64 kg)  Height: 6\' 2"  (1.88 m)   Gen: Pleasant, well-nourished, in no distress,  normal affect  ENT: No lesions,  mouth clear,  oropharynx clear, no postnasal drip  Neck: No JVD, no stridor  Lungs: No use of accessory muscles, no crackles or wheezing on normal respiration, no wheeze on forced expiration  Cardiovascular: RRR, heart sounds normal, no murmur or gallops, no peripheral edema  Musculoskeletal: No deformities, no cyanosis or clubbing  Neuro: alert, awake, non focal  Skin: Warm, no lesions or rash     Assessment & Plan:  Abnormal CT of the chest Some macronodular inflammatory change especially at the right base.  Plan to repeat his CT in March to look for interval change.  Based on this we will decide next steps in the evaluation.  SOB (shortness of breath) He did have moderate obstruction on his pulmonary function testing.  Discussed this with him today.  He does not have any other symptoms of COPD.  He wants to hold off for now on bronchodilator therapy, thinks he can impact his exertional dyspnea by working on his exercise and conditioning, weight loss.  Agree with this plan.  We can revisit BD therapy or consider repeating his PFT going forward depending on how his breathing progresses.    April, MD, PhD 04/01/2021, 4:21 PM [facility] [phone] or if no answer before 7:00PM call [phone] For any issues after 7:00PM please call eLink [phone]

## 2021-04-04 DIAGNOSIS — H25813 Combined forms of age-related cataract, bilateral: Secondary | ICD-10-CM | POA: Diagnosis not present

## 2021-04-04 DIAGNOSIS — H52203 Unspecified astigmatism, bilateral: Secondary | ICD-10-CM | POA: Diagnosis not present

## 2021-04-04 DIAGNOSIS — H35373 Puckering of macula, bilateral: Secondary | ICD-10-CM | POA: Diagnosis not present

## 2021-04-30 ENCOUNTER — Ambulatory Visit
Admission: RE | Admit: 2021-04-30 | Discharge: 2021-04-30 | Disposition: A | Discharge status: Home / Self Care | Payer: Medicare Other | Source: Ambulatory Visit | Attending: Emergency Medicine | Admitting: Emergency Medicine

## 2021-04-30 DIAGNOSIS — R911 Solitary pulmonary nodule: Secondary | ICD-10-CM

## 2021-04-30 DIAGNOSIS — R918 Other nonspecific abnormal finding of lung field: Secondary | ICD-10-CM | POA: Diagnosis not present

## 2021-04-30 DIAGNOSIS — J439 Emphysema, unspecified: Secondary | ICD-10-CM | POA: Diagnosis not present

## 2021-04-30 DIAGNOSIS — R0602 Shortness of breath: Secondary | ICD-10-CM | POA: Diagnosis not present

## 2021-04-30 DIAGNOSIS — I700 Atherosclerosis of aorta: Secondary | ICD-10-CM | POA: Diagnosis not present

## 2021-05-02 ENCOUNTER — Ambulatory Visit: Payer: Medicare Other | Admitting: Emergency Medicine

## 2021-05-02 ENCOUNTER — Encounter: Payer: Self-pay | Admitting: Emergency Medicine

## 2021-05-02 ENCOUNTER — Other Ambulatory Visit: Payer: Self-pay

## 2021-05-02 DIAGNOSIS — R9389 Abnormal findings on diagnostic imaging of other specified body structures: Secondary | ICD-10-CM

## 2021-05-02 DIAGNOSIS — J449 Chronic obstructive pulmonary disease, unspecified: Secondary | ICD-10-CM

## 2021-05-02 NOTE — Assessment & Plan Note (Signed)
Right basilar groundglass, mixed density inflammatory change has resolved.  Etiology unclear.  No intervention to be made at this time.  He did have a newly identified 5 mm left upper lobe pulmonary nodule that will need to be followed.  I think it would be reasonable to refer him to the lung cancer screening program and follow the nodule this way.  He agrees.  I did talk to him about the Veracyte trial.  He wants to defer for now. ?

## 2021-05-02 NOTE — Addendum Note (Signed)
Addended by: Dorisann Frames R on: 05/02/2021 11:44 AM ? ? Modules accepted: Orders ? ?

## 2021-05-02 NOTE — Progress Notes (Signed)
? ?Subjective:  ? ? Patient ID: Bernard Gonzalez, male    DOB: [birth year], 71 y.o.   MRN: [id number] ? ?HPI ?71 year old former smoker (20-25 pack years) with a history of GERD, migraine headaches.  He has been experiencing progressive exertional shortness of breath, appears to have evolved due to sedentary lifestyle during the pandemic.  No chest pain.  He was seen by Dr. Mayford Knife with cardiology and had a reassuring stress Myoview, cardiac CT as below. ? ?He reports exertional SOB that has progressed over last 2 yrs, more so over last 6 months. No CP. Has gained about 20 lbs over 2 yrs. No cough. He snores.  ? ?Cardiac CT 01/30/2021 reviewed by me shows some peribronchial vascular interstitial thickening with some groundglass change and micro macro nodularity most pronounced right lower lobe.  No overt emphysema ? ?ROV 04/01/21 --71 year old former smoker evaluated in late December 2022 for progressive exertional shortness of breath.  He had a reassuring cardiac evaluation.  He also had some bronchiectatic changes and micro macro nodularity on CT scan of the chest, question possible atypical infection.  He underwent pulmonary function testing today and is planning for repeat CT chest in March. ?He tells me that his breathing is stable. He is still limited with heavier exertion. No real wheeze or cough.  ? ?Pulmonary function testing from today reviewed by me shows moderate obstruction without a bronchodilator response, normal lung volumes, normal diffusion capacity.  There is curve of his flow-volume loop. ? ? ?ROV 05/02/21 --follow-up visit 71 year old gentleman, former smoker, evaluated for dyspnea and found to have bronchiectatic change, micro/macro nodularity on cardiac CT scan of the chest.  Moderate obstruction on pulmonary function testing.  At his last visit we decided to defer BD therapy for now.  Repeated his CT scan of the chest as below. Discussed Veracyte trial  ? ?CT chest 04/30/2021 reviewed by me, shows no  mediastinal or hilar adenopathy, moderate emphysematous change without any consolidation, 5 mm left upper lobe pulmonary nodule.  Some septal thickening groundglass change at the right base have all resolved.  ? ? ?Review of Systems ?As per HPI ? ?Past Medical History:  ?Diagnosis Date  ? Arthritis   ? Difficulty in urination   ? Family history of prostate problems   ? Headache   ? Heartburn   ? Indigestion   ? Medical history non-contributory   ? Migraine   ? Wears glasses   ?  ? ?Family History  ?Problem Relation Age of Onset  ? Thyroid disease Mother   ? Pneumonia Father   ? Migraines Neg Hx   ?  ? ?Social History  ? ?Socioeconomic History  ? Marital status: Married  ?  Spouse name: Not on file  ? Number of children: 5  ? Years of education: 64  ? Highest education level: Not on file  ?Occupational History  ? Occupation: retired  ?Tobacco Use  ? Smoking status: Former  ?  Packs/day: 1.00  ?  Types: Cigarettes  ?  Quit date: 05/25/2014  ?  Years since quitting: 6.9  ? Smokeless tobacco: Never  ? Tobacco comments:  ?  stppoed 07/03/12  ?Substance and Sexual Activity  ? Alcohol use: Yes  ?  Alcohol/week: 0.0 standard drinks  ?  Comment: occasionally  ? Drug use: No  ? Sexual activity: Not on file  ?  Comment: was smoking 1 pk week  ?Other Topics Concern  ? Not on file  ?Social History Narrative  ?  Patient drinks 2 cups of caffeine daily.  ? Patient is left handed.  ? ?Social Determinants of Health  ? ?Financial Resource Strain: Not on file  ?Food Insecurity: Not on file  ?Transportation Needs: Not on file  ?Physical Activity: Not on file  ?Stress: Not on file  ?Social Connections: Not on file  ?Intimate Partner Violence: Not on file  ?  ?From [place],  ?Has lived in [place], [place] ?No military ?Has done sales, has also worked at post office ?No TB exposure ? ?Allergies  ?Allergen Reactions  ? Promethazine-Dm Other (See Comments)  ?  ? ?Outpatient Medications Prior to Visit  ?Medication Sig Dispense Refill  ?  aspirin-acetaminophen-caffeine (EXCEDRIN MIGRAINE) 250-250-65 MG tablet Take by mouth every 6 (six) hours as needed for headache.    ? atorvastatin (LIPITOR) 20 MG tablet Take 20 mg by mouth daily.    ? buPROPion (WELLBUTRIN SR) 150 MG 12 hr tablet Take 150 mg by mouth 2 (two) times daily.    ? ezetimibe (ZETIA) 10 MG tablet Take 10 mg by mouth daily.    ? famotidine (PEPCID) 10 MG tablet Take 10 mg by mouth 2 (two) times daily.    ? Multiple Vitamins-Minerals (CENTRUM SILVER ADULT 50+) TABS Take by mouth.    ? pantoprazole (PROTONIX) 40 MG tablet Take 40 mg by mouth daily.    ? propranolol (INDERAL) 10 MG tablet Take 2 tablets by mouth  twice a day 360 tablet 3  ? tamsulosin (FLOMAX) 0.4 MG CAPS capsule Take 0.8 mg by mouth daily.    ? venlafaxine XR (EFFEXOR-XR) 75 MG 24 hr capsule TAKE 3 CAPSULES BY MOUTH  DAILY (Patient not taking: Reported on 05/02/2021) 270 capsule 3  ? ?No facility-administered medications prior to visit.  ? ? ? ? ?   ?Objective:  ? Physical Exam ? ?Vitals:  ? 05/02/21 1043  ?BP: 138/82  ?Pulse: 60  ?Temp: 98.2 ?F (36.8 ?C)  ?TempSrc: Oral  ?SpO2: 96%  ?Weight: 244 lb (110.7 kg)  ?Height: 6\' 3"  (1.905 m)  ? ?Gen: Pleasant, well-nourished, in no distress,  normal affect ? ?ENT: No lesions,  mouth clear,  oropharynx clear, no postnasal drip ? ?Neck: No JVD, no stridor ? ?Lungs: No use of accessory muscles, no crackles or wheezing on normal respiration, no wheeze on forced expiration ? ?Cardiovascular: RRR, heart sounds normal, no murmur or gallops, no peripheral edema ? ?Musculoskeletal: No deformities, no cyanosis or clubbing ? ?Neuro: alert, awake, non focal ? ?Skin: Warm, no lesions or rash ? ?   ?Assessment & Plan:  ?Abnormal CT of the chest ?Right basilar groundglass, mixed density inflammatory change has resolved.  Etiology unclear.  No intervention to be made at this time.  He did have a newly identified 5 mm left upper lobe pulmonary nodule that will need to be followed.  I think it  would be reasonable to refer him to the lung cancer screening program and follow the nodule this way.  He agrees.  I did talk to him about the Veracyte trial.  He wants to defer for now. ? ?COPD Gold A ?Obstruction noted on his pulmonary function testing but asymptomatic.  He has good functional capacity, walks.  He will contact me if his breathing changes and we would consider bronchodilator therapy at that time. ? ? ? ? , MD, PhD ?05/02/2021, 11:14 AM ?[facility] ?[phone] or if no answer before 7:00PM call [phone] ?For any issues after 7:00PM please call  eLink [phone] ? ? ?

## 2021-05-02 NOTE — Patient Instructions (Signed)
We reviewed your CT scan of the chest today.  The inflammatory change that we were following in the right lung has resolved. ?We will refer you to the lung cancer screening program with plans to perform your first low-dose CT scan of the chest in March 2024.  This will allow [place] to follow your small left pulmonary nodule. ?We will hold off on any inhaler medication for now.  Please contact us if your breathing changes. ?Follow Dr. Lamonte Sakai in 1 year or sooner if you have any problems. ?

## 2021-05-02 NOTE — Assessment & Plan Note (Signed)
Obstruction noted on his pulmonary function testing but asymptomatic.  He has good functional capacity, walks.  He will contact me if his breathing changes and we would consider bronchodilator therapy at that time. ?

## 2021-06-23 DIAGNOSIS — E042 Nontoxic multinodular goiter: Secondary | ICD-10-CM | POA: Diagnosis not present

## 2021-06-23 DIAGNOSIS — E785 Hyperlipidemia, unspecified: Secondary | ICD-10-CM | POA: Diagnosis not present

## 2021-11-05 ENCOUNTER — Other Ambulatory Visit: Payer: Self-pay | Admitting: *Deleted

## 2021-11-05 DIAGNOSIS — E042 Nontoxic multinodular goiter: Secondary | ICD-10-CM

## 2021-11-07 DIAGNOSIS — E7800 Pure hypercholesterolemia, unspecified: Secondary | ICD-10-CM | POA: Diagnosis not present

## 2021-11-07 DIAGNOSIS — E559 Vitamin D deficiency, unspecified: Secondary | ICD-10-CM | POA: Diagnosis not present

## 2021-11-07 DIAGNOSIS — Z79899 Other long term (current) drug therapy: Secondary | ICD-10-CM | POA: Diagnosis not present

## 2021-11-14 DIAGNOSIS — Z79899 Other long term (current) drug therapy: Secondary | ICD-10-CM | POA: Diagnosis not present

## 2021-11-14 DIAGNOSIS — E559 Vitamin D deficiency, unspecified: Secondary | ICD-10-CM | POA: Diagnosis not present

## 2021-11-14 DIAGNOSIS — K2210 Ulcer of esophagus without bleeding: Secondary | ICD-10-CM | POA: Diagnosis not present

## 2021-11-14 DIAGNOSIS — Z0000 Encounter for general adult medical examination without abnormal findings: Secondary | ICD-10-CM | POA: Diagnosis not present

## 2021-11-14 DIAGNOSIS — J439 Emphysema, unspecified: Secondary | ICD-10-CM | POA: Diagnosis not present

## 2021-11-14 DIAGNOSIS — E7800 Pure hypercholesterolemia, unspecified: Secondary | ICD-10-CM | POA: Diagnosis not present

## 2021-11-19 ENCOUNTER — Ambulatory Visit
Admission: RE | Admit: 2021-11-19 | Discharge: 2021-11-19 | Disposition: A | Discharge status: Home / Self Care | Payer: Medicare Other | Source: Ambulatory Visit | Attending: *Deleted | Admitting: *Deleted

## 2021-11-19 DIAGNOSIS — E042 Nontoxic multinodular goiter: Secondary | ICD-10-CM | POA: Diagnosis not present

## 2021-11-24 DIAGNOSIS — E042 Nontoxic multinodular goiter: Secondary | ICD-10-CM | POA: Diagnosis not present

## 2022-02-19 IMAGING — CT CT CARDIAC CORONARY ARTERY CALCIUM SCORE
3 series · 13 of 20 positions shown, 15 images · non-contrast
Comparison: None.
COMPARISON: None.

Addendum:
EXAM:
OVER-READ INTERPRETATION  CT CHEST

The following report is an over-read performed by radiologist Dr.
Zeinab Tiger [REDACTED] on 01/30/2021. This
over-read does not include interpretation of cardiac or coronary
anatomy or pathology. The coronary calcium score interpretation by
the cardiologist is attached.
CLINICAL DATA: Cardiovascular Disease Risk stratification
Coronary Calcium Score
TECHNIQUE: A gated, non-contrast computed tomography scan of the heart was
performed using 3mm slice thickness. Axial images were analyzed on a
dedicated workstation. Calcium scoring of the coronary arteries was
performed using the Agatston method.

[Series 2: cascseq 2.0 sa36 70% (id) · axial · 0.46mm/px · z∈[-231,-177]mm · 3 of 68 slices shown]
[im 14/68  vessel]
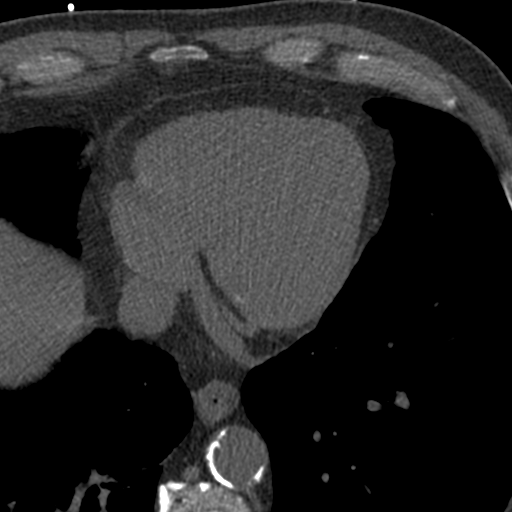
[im 27/68  vessel]
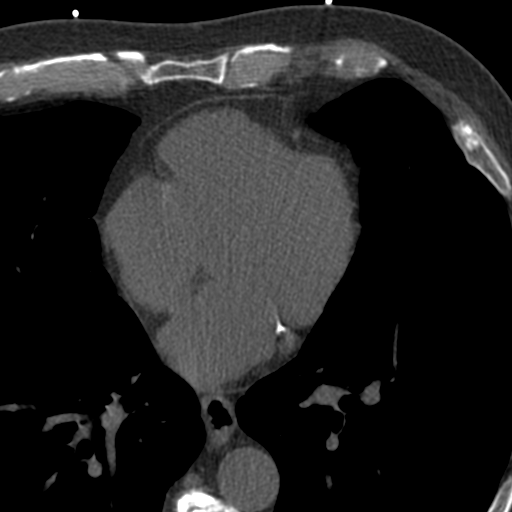
[im 41/68  vessel]
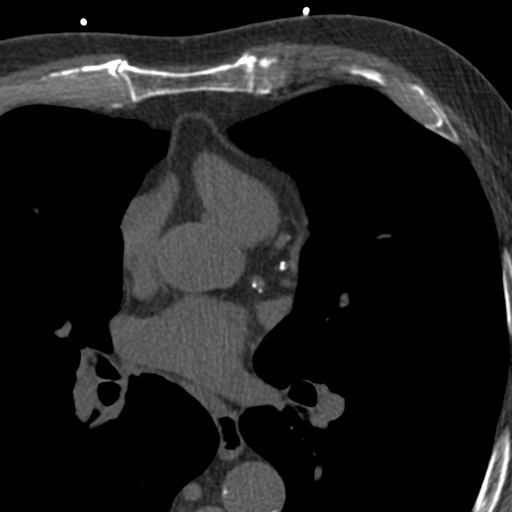

[Series 3: cascseq 2.0 bf37 st · axial · 0.75mm/px · z∈[-235,-147]mm · 5 of 68 slices shown, 7 images]
[im 12/68  vessel]
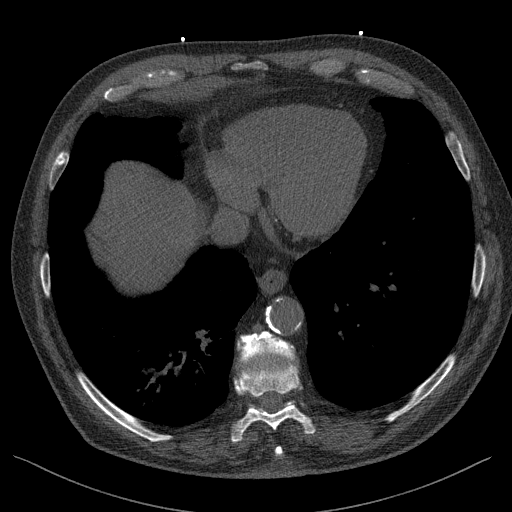
[im 12/68  lung]
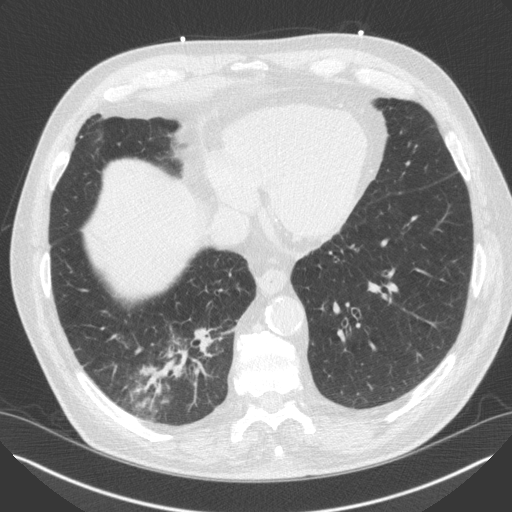
[im 23/68  vessel]
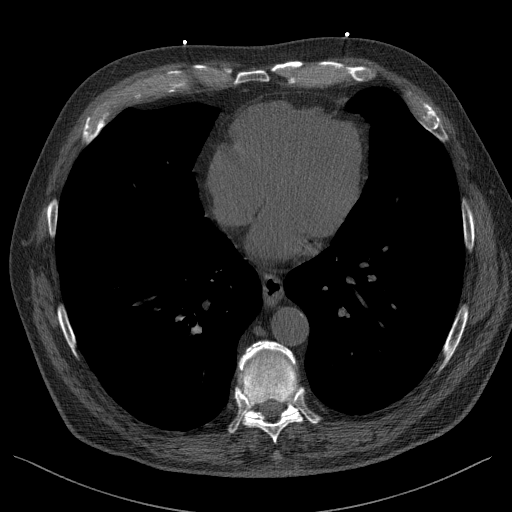
[im 34/68  vessel]
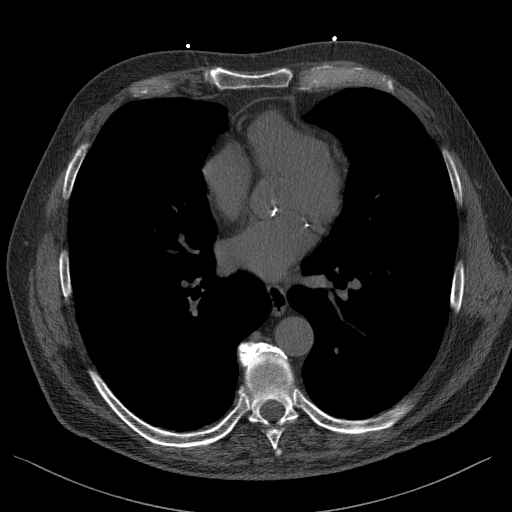
[im 45/68  vessel]
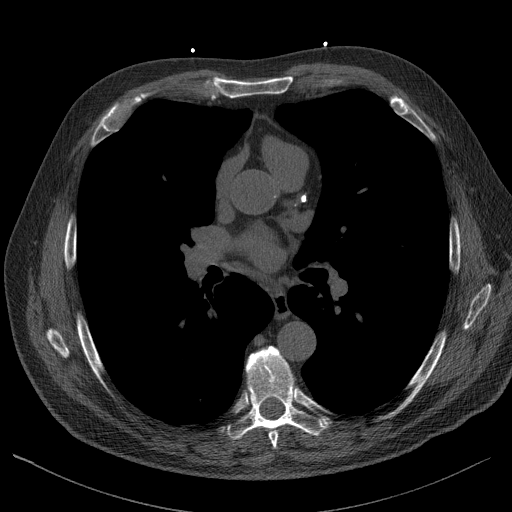
[im 56/68  vessel]
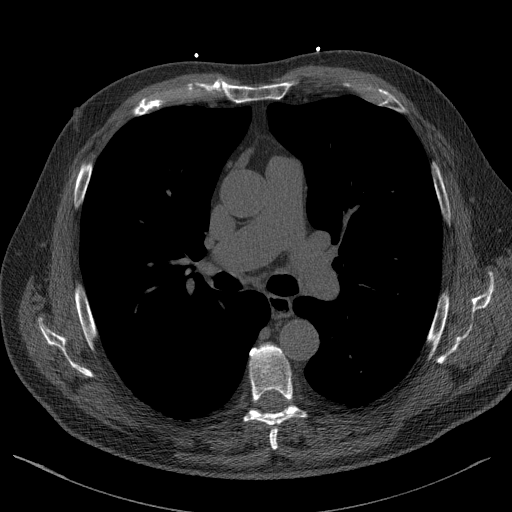
[im 56/68  lung]
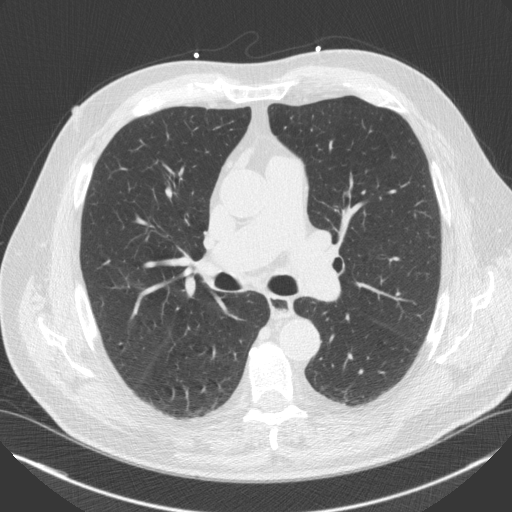

[Series 4: cascseq 2.0 br59 lung · axial · 0.75mm/px · z∈[-235,-147]mm · 5 of 68 slices shown]
[im 12/68  lung]
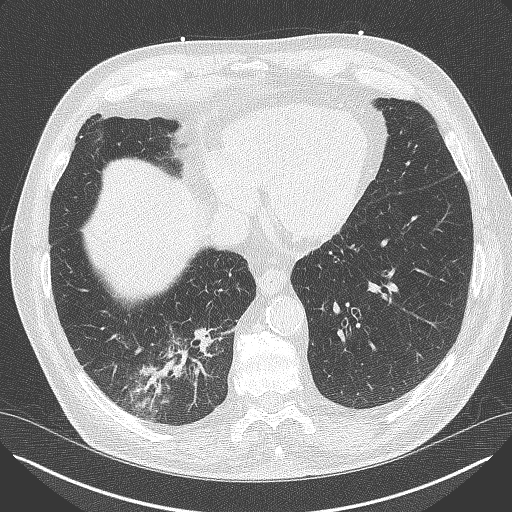
[im 23/68  lung]
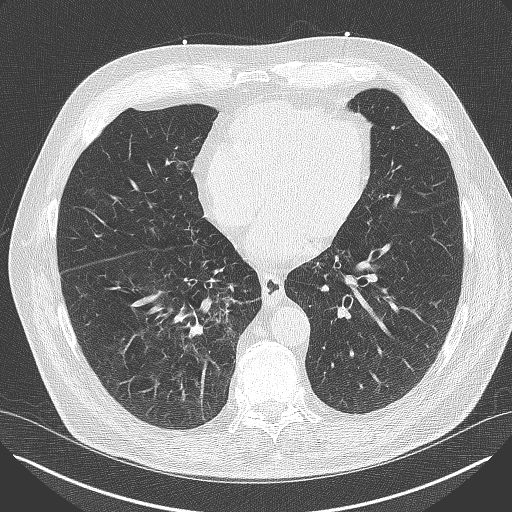
[im 34/68  lung]
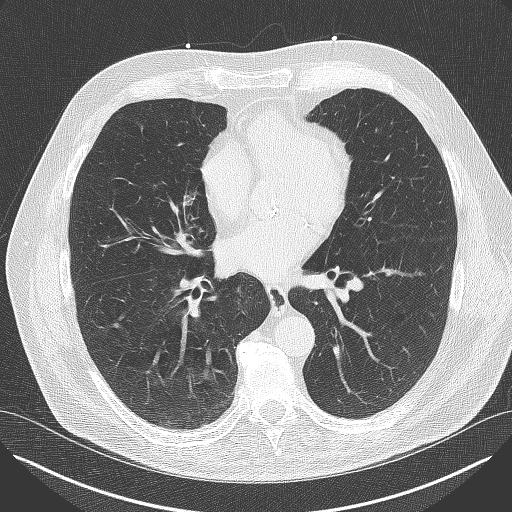
[im 45/68  lung]
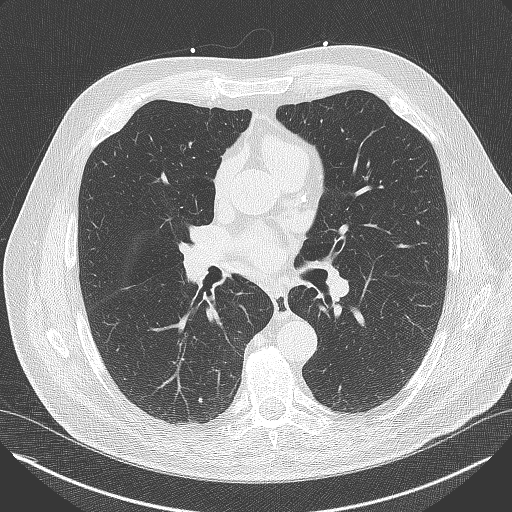
[im 56/68  lung]
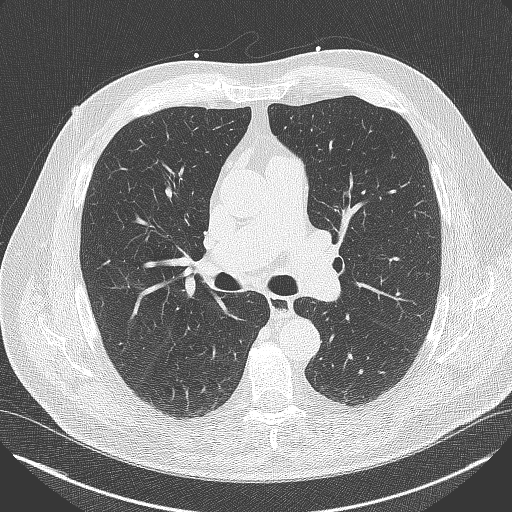

[13 of 20 positions shown; findings below may reference images not displayed]

FINDINGS: Atherosclerotic calcifications are noted in the thoracic aorta.
There are patchy areas with mild cylindrical bronchiectasis and
thickening of the peribronchovascular interstitium, with some
peribronchovascular ground-glass attenuation as well as some micro
and macronodularity, most evident in the right lower lobe. More
isolated left lower lobe 5 mm pulmonary nodule (axial image 64 of
series 4). Within the visualized portions of the thorax there are no
other larger more suspicious appearing pulmonary nodules or masses,
there is no acute consolidative airspace disease, no pleural
effusions, no pneumothorax and no lymphadenopathy. Visualized
portions of the upper abdomen demonstrates diffuse low attenuation
throughout the visualized hepatic parenchyma, indicative of hepatic
steatosis. There are no aggressive appearing lytic or blastic
lesions noted in the visualized portions of the skeleton.
IMPRESSION: 1. There is a spectrum of findings in the lungs concerning for
probable chronic indolent atypical infectious process such as HAIMIS
(mycobacterium avium intracellulare), as above.
2. 5 mm left lower lobe pulmonary nodule. No follow-up needed if
patient is low-risk. Non-contrast chest CT can be considered in 12
months if patient is high-risk. This recommendation follows the
consensus statement: Guidelines for Management of Incidental
Pulmonary Nodules Detected on CT Images: From the [HOSPITAL]
3. Aortic Atherosclerosis (4YP72-Q4L.L).
4. Hepatic steatosis.
FINDINGS: Coronary arteries: Normal origins.

Coronary Calcium Score:

Left main: 0

Left anterior descending artery: 46

Left circumflex artery: 0

Right coronary artery: 0

Total: 46

Percentile: 28

Pericardium: Normal.

Aorta: Normal caliber of ascending aorta. Aortic atherosclerosis
noted.

Non-cardiac: See separate report from [REDACTED].
IMPRESSION: Coronary calcium score of 46. This was 28th percentile for age-,
race-, and sex-matched controls.



If CAC=0, it is reasonable to withhold statin therapy and reassess
in 5 to 10 years, as long as higher risk conditions are absent
(diabetes mellitus, family history of premature CHD in first degree
relatives (males <55 years; females <65 years), cigarette smoking,
or LDL >=190 mg/dL).

If CAC is 1 to 99, it is reasonable to initiate statin therapy for
patients >=55 years of age.

If CAC is >=100 or >=75th percentile, it is reasonable to initiate
statin therapy at any age.

Cardiology referral should be considered for patients with CAC
scores >=400 or >=75th percentile.

*6116 AHA/ACC/AACVPR/AAPA/ABC/SCHOCK/ERRINGTON/DITTMER/Jaqui/KOURAICHI/KAPILAN/ASURUMA
Guideline on the Management of Blood Cholesterol: A Report of the
American College of Cardiology/American Heart Association Task Force
on Clinical Practice Guidelines. J Am Coll Cardiol.
2049;73(24):7759-7295.

*** End of Addendum ***
EXAM:
OVER-READ INTERPRETATION  CT CHEST

The following report is an over-read performed by radiologist Dr.
Zeinab Tiger [REDACTED] on 01/30/2021. This
over-read does not include interpretation of cardiac or coronary
anatomy or pathology. The coronary calcium score interpretation by
the cardiologist is attached.
FINDINGS: Atherosclerotic calcifications are noted in the thoracic aorta.
There are patchy areas with mild cylindrical bronchiectasis and
thickening of the peribronchovascular interstitium, with some
peribronchovascular ground-glass attenuation as well as some micro
and macronodularity, most evident in the right lower lobe. More
isolated left lower lobe 5 mm pulmonary nodule (axial image 64 of
series 4). Within the visualized portions of the thorax there are no
other larger more suspicious appearing pulmonary nodules or masses,
there is no acute consolidative airspace disease, no pleural
effusions, no pneumothorax and no lymphadenopathy. Visualized
portions of the upper abdomen demonstrates diffuse low attenuation
throughout the visualized hepatic parenchyma, indicative of hepatic
steatosis. There are no aggressive appearing lytic or blastic
lesions noted in the visualized portions of the skeleton.
IMPRESSION: 1. There is a spectrum of findings in the lungs concerning for
probable chronic indolent atypical infectious process such as HAIMIS
(mycobacterium avium intracellulare), as above.
2. 5 mm left lower lobe pulmonary nodule. No follow-up needed if
patient is low-risk. Non-contrast chest CT can be considered in 12
months if patient is high-risk. This recommendation follows the
consensus statement: Guidelines for Management of Incidental
Pulmonary Nodules Detected on CT Images: From the [HOSPITAL]
3. Aortic Atherosclerosis (4YP72-Q4L.L).
4. Hepatic steatosis.

## 2022-04-06 DIAGNOSIS — H25813 Combined forms of age-related cataract, bilateral: Secondary | ICD-10-CM | POA: Diagnosis not present

## 2022-04-06 DIAGNOSIS — H35373 Puckering of macula, bilateral: Secondary | ICD-10-CM | POA: Diagnosis not present

## 2022-04-06 DIAGNOSIS — H5213 Myopia, bilateral: Secondary | ICD-10-CM | POA: Diagnosis not present

## 2022-04-24 ENCOUNTER — Other Ambulatory Visit: Payer: Self-pay | Admitting: *Deleted

## 2022-04-24 DIAGNOSIS — Z87891 Personal history of nicotine dependence: Secondary | ICD-10-CM

## 2022-04-24 DIAGNOSIS — Z122 Encounter for screening for malignant neoplasm of respiratory organs: Secondary | ICD-10-CM

## 2022-05-13 ENCOUNTER — Ambulatory Visit (INDEPENDENT_AMBULATORY_CARE_PROVIDER_SITE_OTHER): Payer: Medicare Other | Admitting: Pulmonary Disease

## 2022-05-13 ENCOUNTER — Encounter: Payer: Self-pay | Admitting: Pulmonary Disease

## 2022-05-13 DIAGNOSIS — Z87891 Personal history of nicotine dependence: Secondary | ICD-10-CM | POA: Diagnosis not present

## 2022-05-13 DIAGNOSIS — Z122 Encounter for screening for malignant neoplasm of respiratory organs: Secondary | ICD-10-CM

## 2022-05-13 NOTE — Patient Instructions (Signed)
Thank you for participating in the [facility]. It was our pleasure to meet you today. We will call you with the results of your scan within the next few days. Your scan will be assigned a Lung RADS category score by the physicians reading the scans.  This Lung RADS score determines follow up scanning.  See below for description of categories, and follow up screening recommendations. We will be in touch to schedule your follow up screening annually or based on recommendations of our providers. We will fax a copy of your scan results to your Primary Care Physician, or the physician who referred you to the program, to ensure they have the results. Please call the office if you have any questions or concerns regarding your scanning experience or results.  Our office number is [phone]. Please speak with Denise Phelps, RN. , or  Denise Buckner RN, They are  our Lung Cancer Screening RN.'s If They are unavailable when you call, Please leave a message on the voice mail. We will return your call at our earliest convenience.This voice mail is monitored several times a day.  Remember, if your scan is normal, we will scan you annually as long as you continue to meet the criteria for the program. (Age 50-80, Current smoker or smoker who has quit within the last 15 years). If you are a smoker, remember, quitting is the single most powerful action that you can take to decrease your risk of lung cancer and other pulmonary, breathing related problems. We know quitting is hard, and we are here to help.  Please let us know if there is anything we can do to help you meet your goal of quitting. If you are a former smoker, congratulations. We are proud of you! Remain smoke free! Remember you can refer friends or family members through the number above.  We will screen them to make sure they meet criteria for the program. Thank you for helping us take better care of you by  participating in Lung Screening.  You can receive free nicotine replacement therapy ( patches, gum or mints) by calling 1-800-QUIT NOW. Please call so we can get you on the path to becoming  a non-smoker. I know it is hard, but you can do this!  Lung RADS Categories:  Lung RADS 1: no nodules or definitely non-concerning nodules.  Recommendation is for a repeat annual scan in 12 months.  Lung RADS 2:  nodules that are non-concerning in appearance and behavior with a very low likelihood of becoming an active cancer. Recommendation is for a repeat annual scan in 12 months.  Lung RADS 3: nodules that are probably non-concerning , includes nodules with a low likelihood of becoming an active cancer.  Recommendation is for a 6-month repeat screening scan. Often noted after an upper respiratory illness. We will be in touch to make sure you have no questions, and to schedule your 6-month scan.  Lung RADS 4 A: nodules with concerning findings, recommendation is most often for a follow up scan in 3 months or additional testing based on our provider's assessment of the scan. We will be in touch to make sure you have no questions and to schedule the recommended 3 month follow up scan.  Lung RADS 4 B:  indicates findings that are concerning. We will be in touch with you to schedule additional diagnostic testing based on our provider's  assessment of the scan.  Other options for assistance in smoking cessation (   As covered by your insurance benefits)  Hypnosis for smoking cessation  [employer] [phone]  Acupuncture for smoking cessation  [facility] [phone]   

## 2022-05-13 NOTE — Progress Notes (Signed)
  Virtual Visit via Telephone Note  I connected with Bernard Gonzalez on 05/13/22 at 12:00 PM EDT by telephone and verified that I am speaking with the correct person using two identifiers.  Location: Patient: HOME  Provider: [address] [place], [place], [place] [address]       Shared Decision Making Visit Lung Cancer Screening Program [phone])   Eligibility: Age 72 y.o. Pack Years Smoking History Calculation 34 (# packs/per year x # years smoked) Recent History of coughing up blood  no Unexplained weight loss? no ( >Than 15 pounds within the last 6 months ) Prior History Lung / other cancer no (Diagnosis within the last 5 years already requiring surveillance chest CT Scans). Smoking Status Former Smoker Former Smokers: Years since quit: 6 years  Quit Date: 2018  Visit Components: Discussion included one or more decision making aids. yes Discussion included risk/benefits of screening. yes Discussion included potential follow up diagnostic testing for abnormal scans. yes Discussion included meaning and risk of over diagnosis. yes Discussion included meaning and risk of False Positives. yes Discussion included meaning of total radiation exposure. yes  Counseling Included: Importance of adherence to annual lung cancer LDCT screening. yes Impact of comorbidities on ability to participate in the program. yes Ability and willingness to under diagnostic treatment. yes  Smoking Cessation Counseling:  Former Smokers:  Discussed the importance of maintaining cigarette abstinence. yes Diagnosis Code: Personal History of Nicotine Dependence. [id number] Information about tobacco cessation classes and interventions provided to patient. Yes Patient provided with "ticket" for LDCT Scan. yes Written Order for Lung Cancer Screening with LDCT placed in Epic. Yes (CT Chest Lung Cancer Screening Low Dose W/O CM) [id number] Z12.2-Screening of respiratory organs Z87.891-Personal history of  nicotine dependence   Lauraine Rinne, NP

## 2022-05-15 ENCOUNTER — Ambulatory Visit
Admission: RE | Admit: 2022-05-15 | Discharge: 2022-05-15 | Disposition: A | Discharge status: Home / Self Care | Payer: Medicare Other | Source: Ambulatory Visit | Attending: Acute Care | Admitting: Acute Care

## 2022-05-15 DIAGNOSIS — Z87891 Personal history of nicotine dependence: Secondary | ICD-10-CM

## 2022-05-15 DIAGNOSIS — Z122 Encounter for screening for malignant neoplasm of respiratory organs: Secondary | ICD-10-CM

## 2022-05-18 ENCOUNTER — Other Ambulatory Visit: Payer: Self-pay | Admitting: Acute Care

## 2022-05-18 DIAGNOSIS — Z122 Encounter for screening for malignant neoplasm of respiratory organs: Secondary | ICD-10-CM

## 2022-05-18 DIAGNOSIS — Z87891 Personal history of nicotine dependence: Secondary | ICD-10-CM

## 2022-05-20 IMAGING — CT CT CHEST SUPER D W/O CM
2 of 5 series · 14 of 36 positions shown, 17 images · non-contrast
Comparison: Cardiac CT from Saturday January, 2021.

CLINICAL DATA: 71-year-old male with shortness of breath for 6
months.

EXAM:
CT CHEST WITHOUT CONTRAST
TECHNIQUE: Multidetector CT imaging of the chest was performed using thin slice
collimation for electromagnetic bronchoscopy planning purposes,
without intravenous contrast.
RADIATION DOSE REDUCTION: This exam was performed according to the
departmental dose-optimization program which includes automated
exposure control, adjustment of the mA and/or kV according to
patient size and/or use of iterative reconstruction technique.

[Series 4: chest 2.00 br40 s3 · coronal · 0.62mm/px · 3 of 205 slices shown]
[im 41/205  lung]
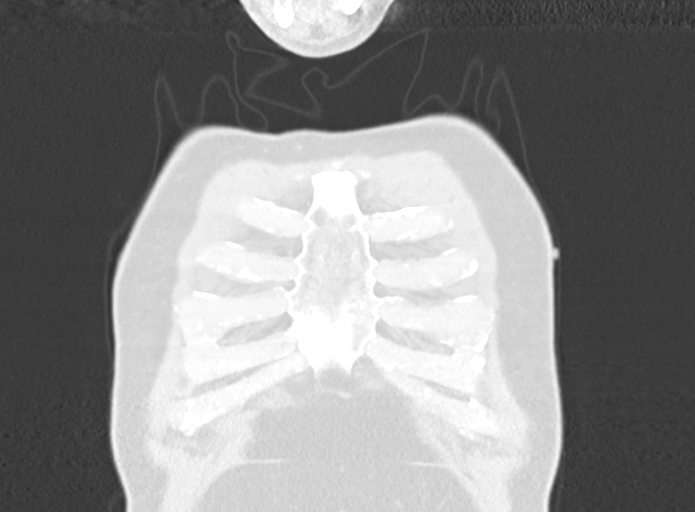
[im 82/205  lung]
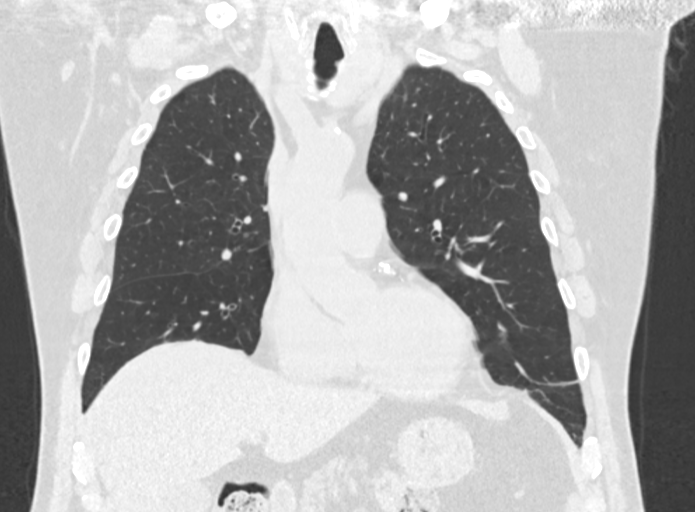
[im 123/205  lung]
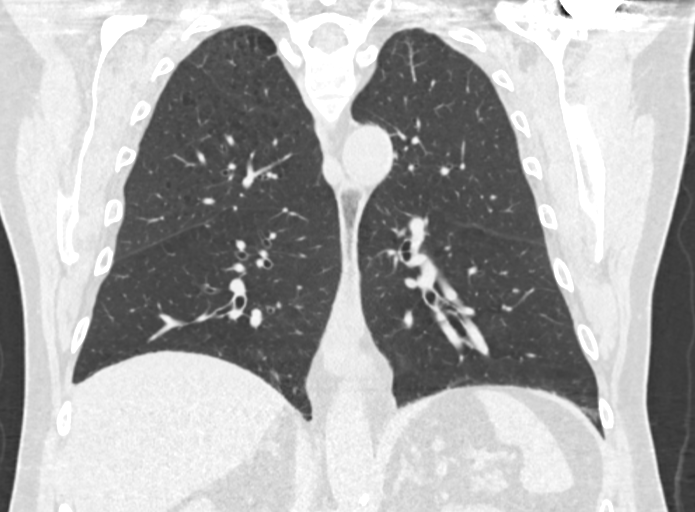

[Series 10: chest 1.00 br40 s3 super d · axial · 0.78mm/px · z∈[+1419,+1690]mm · 11 of 393 slices shown, 14 images]
[im 27/393  mediastinal]
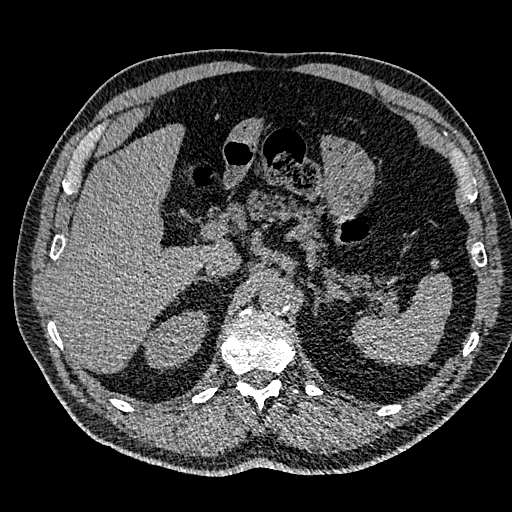
[im 27/393  lung]
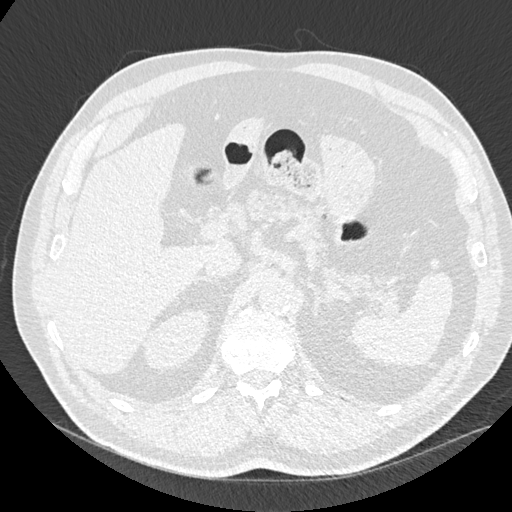
[im 53/393  lung]
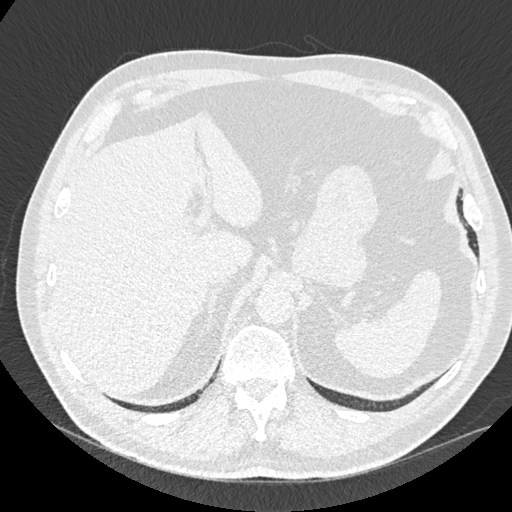
[im 105/393  lung]
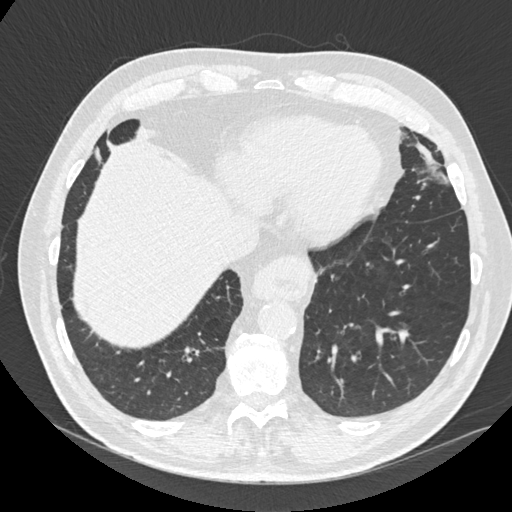
[im 131/393  lung]
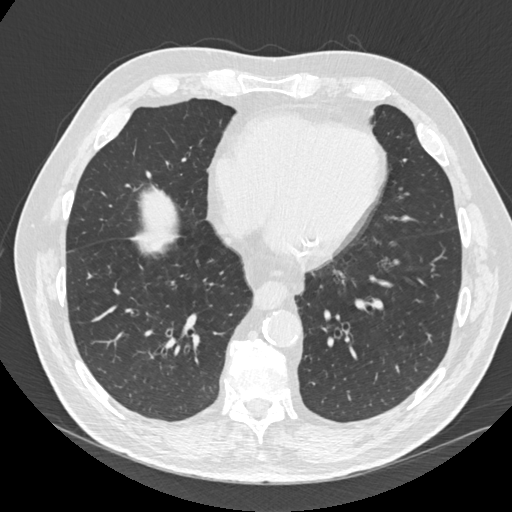
[im 157/393  mediastinal]
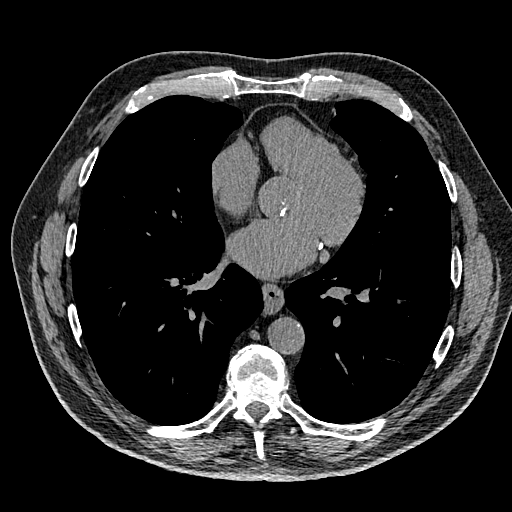
[im 157/393  lung]
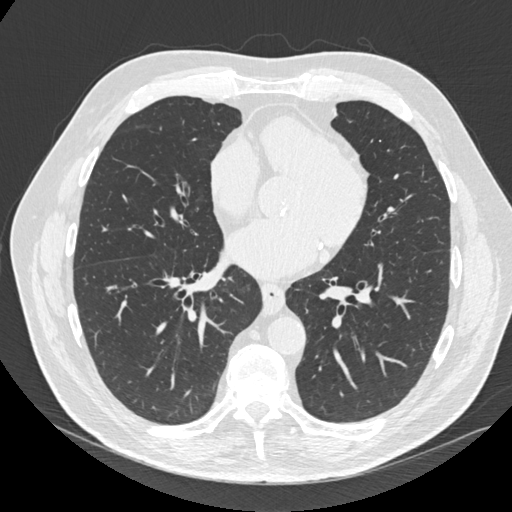
[im 210/393  lung]
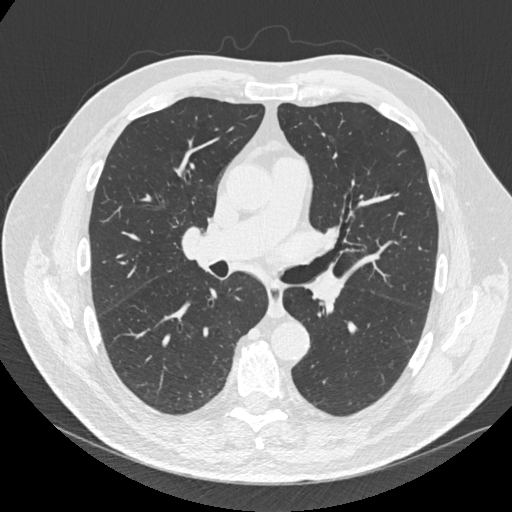
[im 236/393  lung]
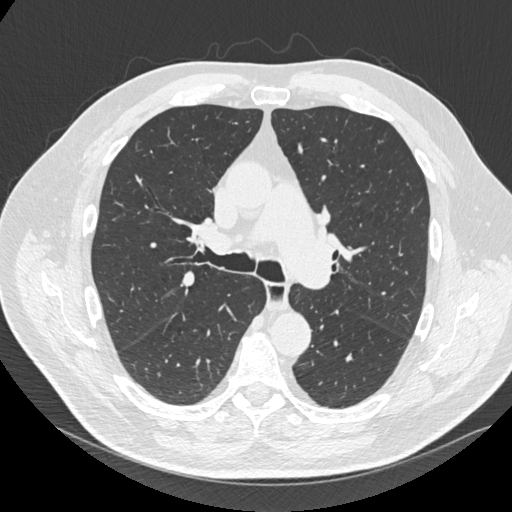
[im 262/393  lung]
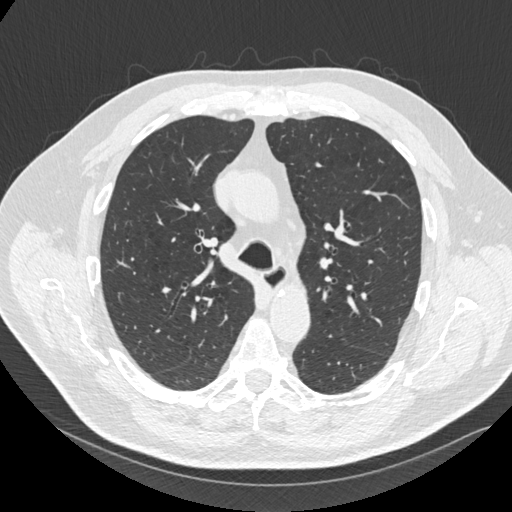
[im 288/393  mediastinal]
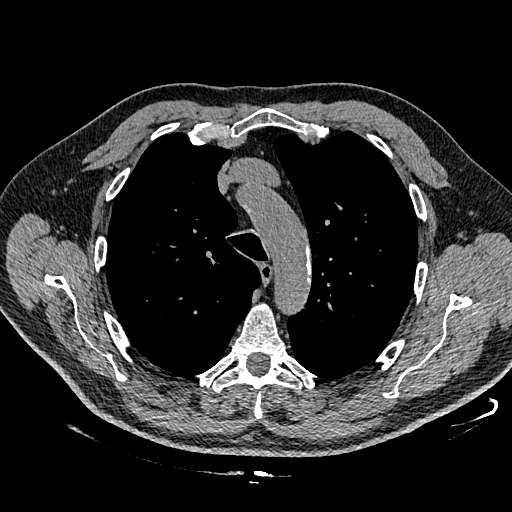
[im 288/393  lung]
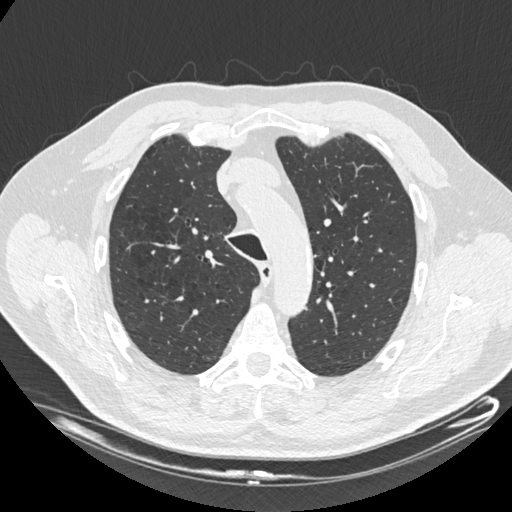
[im 340/393  lung]
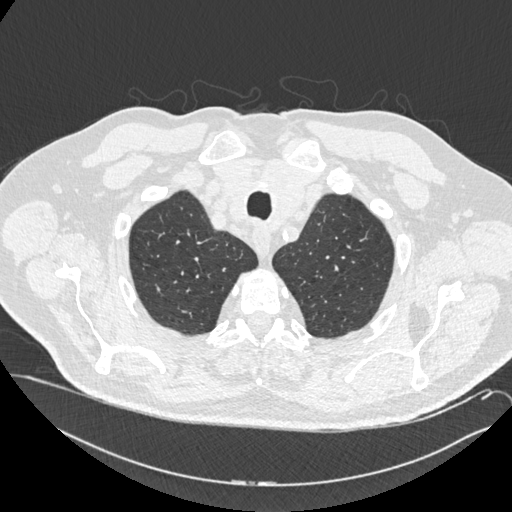
[im 366/393  lung]
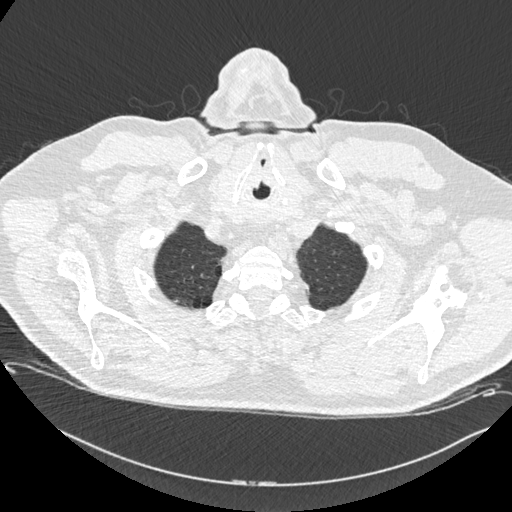

[14 of 36 positions shown; findings below may reference images not displayed]

FINDINGS: Cardiovascular: Calcified atheromatous plaque of the thoracic aorta.
Normal heart size. Three-vessel coronary artery disease. No
pericardial effusion of substantial volume. Normal caliber of
central pulmonary vessels. Limited assessment of cardiovascular
structures given lack of intravenous contrast.

Mediastinum/Nodes: No adenopathy in the chest. Lobular and
heterogeneous thyroid previously evaluated with ultrasound, refer to
prior ultrasound imaging for further detail. Streak artifact about
the LEFT axilla secondary to shoulder arthroplasty on the LEFT. Also
affecting assessment of LEFT thoracic inlet. Mildly patulous
esophagus. Small hiatal hernia. Configuration of the stomach
suggests para esophageal hernia and or Nissen fundoplication in the
past.

Lungs/Pleura: Moderate pulmonary emphysema. No consolidation. No
pleural effusion. Airways are patent.

Small LEFT upper lobe pulmonary nodule (image [DATE]) 5 mm. Lingular
scarring. Airways are patent.

Resolution of ground-glass, septal thickening and nodularity at the
RIGHT lung base have resolved.

Upper Abdomen: Incidental imaging of upper abdominal contents shows
no acute process in the upper abdomen to the extent evaluated. Para
esophageal hernia and or changes of Nissen fundoplication with
patulous distal esophagus. No acute upper

Musculoskeletal: Signs of LEFT shoulder arthroplasty as discussed.
No acute or destructive bone process. Degenerative changes in the
spine with unchanged appearance of lower thoracic compression
fracture at T10.
IMPRESSION: 1. Resolution of ground-glass, septal thickening and nodularity at
the RIGHT lung base compatible with resolving infection or
inflammation. Based on the distribution in the patulous appearance
of the esophagus showing some debris layering dependently in the
lumen the possibility of aspiration related changes are entertained.
2. Small hiatal hernia perhaps paraesophageal and potentially
associated with changes of Nissen fundoplication. Correlate with
history and any symptoms of reflux and/or esophagitis with
consideration for dedicated esophageal evaluation as warranted.
3. Small LEFT upper lobe pulmonary nodule measuring 5 mm. No
follow-up needed if patient is low-risk. Non-contrast chest CT can
be considered in 12 months if patient is high-risk. This
recommendation follows the consensus statement: Guidelines for
Management of Incidental Pulmonary Nodules Detected on CT Images:
4. Pulmonary emphysema and aortic atherosclerosis as discussed.

Aortic Atherosclerosis (0C9SW-74G.G) and Emphysema (0C9SW-LV4.P).

## 2022-11-17 DIAGNOSIS — E559 Vitamin D deficiency, unspecified: Secondary | ICD-10-CM | POA: Diagnosis not present

## 2022-11-17 DIAGNOSIS — Z79899 Other long term (current) drug therapy: Secondary | ICD-10-CM | POA: Diagnosis not present

## 2022-11-17 DIAGNOSIS — E7800 Pure hypercholesterolemia, unspecified: Secondary | ICD-10-CM | POA: Diagnosis not present

## 2022-11-23 DIAGNOSIS — Z23 Encounter for immunization: Secondary | ICD-10-CM | POA: Diagnosis not present

## 2022-11-23 DIAGNOSIS — K2100 Gastro-esophageal reflux disease with esophagitis, without bleeding: Secondary | ICD-10-CM | POA: Diagnosis not present

## 2022-11-23 DIAGNOSIS — E7800 Pure hypercholesterolemia, unspecified: Secondary | ICD-10-CM | POA: Diagnosis not present

## 2022-11-23 DIAGNOSIS — I700 Atherosclerosis of aorta: Secondary | ICD-10-CM | POA: Diagnosis not present

## 2022-11-23 DIAGNOSIS — Z0000 Encounter for general adult medical examination without abnormal findings: Secondary | ICD-10-CM | POA: Diagnosis not present

## 2022-11-23 DIAGNOSIS — E559 Vitamin D deficiency, unspecified: Secondary | ICD-10-CM | POA: Diagnosis not present

## 2023-01-05 ENCOUNTER — Other Ambulatory Visit: Payer: Self-pay

## 2023-01-05 ENCOUNTER — Encounter (HOSPITAL_BASED_OUTPATIENT_CLINIC_OR_DEPARTMENT_OTHER): Payer: Self-pay | Admitting: Emergency Medicine

## 2023-01-05 ENCOUNTER — Emergency Department (HOSPITAL_BASED_OUTPATIENT_CLINIC_OR_DEPARTMENT_OTHER)
Admission: EM | Admit: 2023-01-05 | Discharge: 2023-01-05 | Disposition: A | Discharge status: Home / Self Care | Payer: Medicare Other | Attending: Emergency Medicine | Admitting: Emergency Medicine

## 2023-01-05 DIAGNOSIS — W109XXD Fall (on) (from) unspecified stairs and steps, subsequent encounter: Secondary | ICD-10-CM | POA: Insufficient documentation

## 2023-01-05 DIAGNOSIS — Z7982 Long term (current) use of aspirin: Secondary | ICD-10-CM | POA: Insufficient documentation

## 2023-01-05 DIAGNOSIS — Y9301 Activity, walking, marching and hiking: Secondary | ICD-10-CM | POA: Diagnosis not present

## 2023-01-05 DIAGNOSIS — Z5189 Encounter for other specified aftercare: Secondary | ICD-10-CM

## 2023-01-05 DIAGNOSIS — S80211D Abrasion, right knee, subsequent encounter: Secondary | ICD-10-CM | POA: Insufficient documentation

## 2023-01-05 DIAGNOSIS — S80212D Abrasion, left knee, subsequent encounter: Secondary | ICD-10-CM | POA: Insufficient documentation

## 2023-01-05 DIAGNOSIS — Z4800 Encounter for change or removal of nonsurgical wound dressing: Secondary | ICD-10-CM | POA: Insufficient documentation

## 2023-01-05 DIAGNOSIS — Z4801 Encounter for change or removal of surgical wound dressing: Secondary | ICD-10-CM | POA: Diagnosis not present

## 2023-01-05 NOTE — ED Provider Notes (Signed)
[facility] [facility] Provider Note   CSN: [id number] Arrival date & time: 01/05/23  1238     History  Chief Complaint  Patient presents with   Wound Check    Bernard Gonzalez is a 72 y.o. male with a past medical history significant for migraines who presents to the ED for a wound check.  Patient fell 6 days ago on stairs hitting his bilateral knees. No head injury or LOC. Patient sustained abrasions to bilateral knees.  Patient would like his knees checked to rule out infection.  No fever or chills.  Has been using Dermoplast for symptomatic relief.  No difficulties ambulating.  Has also used over-the-counter antibiotic ointment.  History obtained from patient and past medical records. No interpreter used during encounter.       Home Medications Prior to Admission medications   Medication Sig Start Date End Date Taking? Authorizing Provider  aspirin-acetaminophen-caffeine (EXCEDRIN MIGRAINE) [phone] MG tablet Take by mouth every 6 (six) hours as needed for headache.    [provider]  atorvastatin (LIPITOR) 20 MG tablet Take 20 mg by mouth daily. 09/04/20   [provider]  buPROPion (WELLBUTRIN SR) 150 MG 12 hr tablet Take 150 mg by mouth 2 (two) times daily. 10/17/20   [provider]  ezetimibe (ZETIA) 10 MG tablet Take 10 mg by mouth daily. 10/17/20   [provider]  famotidine (PEPCID) 10 MG tablet Take 10 mg by mouth 2 (two) times daily.    [provider]  Multiple Vitamins-Minerals (CENTRUM SILVER ADULT 50+) TABS Take by mouth.    [provider]  pantoprazole (PROTONIX) 40 MG tablet Take 40 mg by mouth daily. 10/10/20   [provider]  propranolol (INDERAL) 10 MG tablet Take 2 tablets by mouth  twice a day 10/29/15   Butch Penny, NP  tamsulosin (FLOMAX) 0.4 MG CAPS capsule Take 0.8 mg by mouth daily. 09/22/20   [provider]  venlafaxine XR (EFFEXOR-XR) 75 MG 24  hr capsule TAKE 3 CAPSULES BY MOUTH  DAILY Patient not taking: Reported on 05/02/2021 12/25/15   York Spaniel, MD      Allergies    Promethazine-dm    Review of Systems   Review of Systems  Constitutional:  Negative for fever.  Skin:  Positive for wound.    Physical Exam Updated Vital Signs BP (!) 146/88 (BP Location: Right Arm)   Pulse 80   Temp 97.9 F (36.6 C) (Oral)   Resp 20   Ht 6\' 1"  (1.854 m)   Wt 105.2 kg   SpO2 93%   BMI 30.61 kg/m  Physical Exam Vitals and nursing note reviewed.  Constitutional:      General: He is not in acute distress.    Appearance: He is not ill-appearing.  HENT:     Head: Normocephalic.  Eyes:     Pupils: Pupils are equal, round, and reactive to light.  Cardiovascular:     Rate and Rhythm: Normal rate and regular rhythm.     Pulses: Normal pulses.     Heart sounds: Normal heart sounds. No murmur heard.    No friction rub. No gallop.  Pulmonary:     Effort: Pulmonary effort is normal.     Breath sounds: Normal breath sounds.  Abdominal:     General: Abdomen is flat. There is no distension.     Palpations: Abdomen is soft.     Tenderness: There is no abdominal  tenderness. There is no guarding or rebound.  Musculoskeletal:        General: Normal range of motion.     Cervical back: Neck supple.     Comments: Full range of motion of bilateral knees.  Skin:    General: Skin is warm and dry.     Coloration: Skin is not jaundiced.     Comments: Abrasions to bilateral knees.  Very mild erythema.  No purulent drainage.  Neurological:     General: No focal deficit present.     Mental Status: He is alert. Mental status is at baseline.  Psychiatric:        Mood and Affect: Mood normal.        Behavior: Behavior normal.     ED Results / Procedures / Treatments   Labs (all labs ordered are listed, but only abnormal results are displayed) Labs Reviewed - No data to display  EKG None  Radiology No results  found.  Procedures Procedures    Medications Ordered in ED Medications - No data to display  ED Course/ Medical Decision Making/ A&P                                 Medical Decision Making Amount and/or Complexity of Data Reviewed Independent Historian: spouse   71 year old male presents to the ED due to abrasions to bilateral knees that occurred 6 days ago. Patient fell walking up stairs. No head injury or LOC.  No fever or chills.  Upon arrival, stable vitals.  Patient in no acute distress.  Abrasions to bilateral knees without evidence of infection.  Full range of motion of bilateral knees.  Low suspicion for bony fracture, so will hold off on x-ray.  Low suspicion for septic joint. No evidence of cellulitis. Advised patient to continue using antibiotic ointment. Keep wounds clean. Patient stable for discharge. Strict ED precautions discussed with patient. Patient states understanding and agrees to plan. Patient discharged home in no acute distress and stable vitals.       Final Clinical Impression(s) / ED Diagnoses Final diagnoses:  Visit for wound check    Rx / DC Orders ED Discharge Orders     None         Mannie Stabile, PA-C 01/05/23 1450    Loetta Rough, MD 01/06/23 1914

## 2023-01-05 NOTE — ED Triage Notes (Signed)
Pt fell last Wednesday; wants scabbed wounds to bil knees checked

## 2023-01-05 NOTE — ED Notes (Signed)

## 2023-01-05 NOTE — Discharge Instructions (Signed)
It was a pleasure taking care of you today.  As discussed, your wounds look like they are healing well.  Continue to use antibiotic ointment.  Return to the ER for new or worsening symptoms.

## 2023-01-05 NOTE — ED Notes (Signed)
ED Provider at bedside. 

## 2023-01-06 DIAGNOSIS — Z5189 Encounter for other specified aftercare: Secondary | ICD-10-CM | POA: Diagnosis not present

## 2023-01-08 DIAGNOSIS — S80212D Abrasion, left knee, subsequent encounter: Secondary | ICD-10-CM | POA: Diagnosis not present

## 2023-01-08 DIAGNOSIS — L03116 Cellulitis of left lower limb: Secondary | ICD-10-CM | POA: Diagnosis not present

## 2023-01-08 DIAGNOSIS — R030 Elevated blood-pressure reading, without diagnosis of hypertension: Secondary | ICD-10-CM | POA: Diagnosis not present

## 2023-01-16 DIAGNOSIS — Z1212 Encounter for screening for malignant neoplasm of rectum: Secondary | ICD-10-CM | POA: Diagnosis not present

## 2023-01-16 DIAGNOSIS — Z1211 Encounter for screening for malignant neoplasm of colon: Secondary | ICD-10-CM | POA: Diagnosis not present

## 2023-04-08 DIAGNOSIS — H52203 Unspecified astigmatism, bilateral: Secondary | ICD-10-CM | POA: Diagnosis not present

## 2023-04-08 DIAGNOSIS — H02403 Unspecified ptosis of bilateral eyelids: Secondary | ICD-10-CM | POA: Diagnosis not present

## 2023-04-08 DIAGNOSIS — H35371 Puckering of macula, right eye: Secondary | ICD-10-CM | POA: Diagnosis not present

## 2023-04-08 DIAGNOSIS — H2513 Age-related nuclear cataract, bilateral: Secondary | ICD-10-CM | POA: Diagnosis not present

## 2023-06-03 ENCOUNTER — Encounter: Payer: Self-pay | Admitting: Acute Care

## 2023-06-16 ENCOUNTER — Other Ambulatory Visit: Payer: Self-pay | Admitting: Acute Care

## 2023-06-16 DIAGNOSIS — Z122 Encounter for screening for malignant neoplasm of respiratory organs: Secondary | ICD-10-CM

## 2023-06-16 DIAGNOSIS — Z87891 Personal history of nicotine dependence: Secondary | ICD-10-CM

## 2023-07-01 ENCOUNTER — Ambulatory Visit
Admission: RE | Admit: 2023-07-01 | Discharge: 2023-07-01 | Disposition: A | Discharge status: Home / Self Care | Source: Ambulatory Visit | Attending: Acute Care | Admitting: Acute Care

## 2023-07-01 DIAGNOSIS — Z122 Encounter for screening for malignant neoplasm of respiratory organs: Secondary | ICD-10-CM

## 2023-07-01 DIAGNOSIS — Z87891 Personal history of nicotine dependence: Secondary | ICD-10-CM

## 2023-07-27 ENCOUNTER — Other Ambulatory Visit: Payer: Self-pay

## 2023-07-27 DIAGNOSIS — Z87891 Personal history of nicotine dependence: Secondary | ICD-10-CM

## 2023-07-27 DIAGNOSIS — Z122 Encounter for screening for malignant neoplasm of respiratory organs: Secondary | ICD-10-CM

## 2023-11-25 DIAGNOSIS — E559 Vitamin D deficiency, unspecified: Secondary | ICD-10-CM | POA: Diagnosis not present

## 2023-11-25 DIAGNOSIS — Z79899 Other long term (current) drug therapy: Secondary | ICD-10-CM | POA: Diagnosis not present

## 2023-11-25 DIAGNOSIS — E041 Nontoxic single thyroid nodule: Secondary | ICD-10-CM | POA: Diagnosis not present

## 2023-11-25 DIAGNOSIS — Z0000 Encounter for general adult medical examination without abnormal findings: Secondary | ICD-10-CM | POA: Diagnosis not present

## 2023-11-25 DIAGNOSIS — K2100 Gastro-esophageal reflux disease with esophagitis, without bleeding: Secondary | ICD-10-CM | POA: Diagnosis not present

## 2023-11-25 DIAGNOSIS — E7800 Pure hypercholesterolemia, unspecified: Secondary | ICD-10-CM | POA: Diagnosis not present

## 2023-11-25 DIAGNOSIS — J439 Emphysema, unspecified: Secondary | ICD-10-CM | POA: Diagnosis not present

## 2023-11-25 DIAGNOSIS — Z23 Encounter for immunization: Secondary | ICD-10-CM | POA: Diagnosis not present
